# Patient Record
Sex: Female | Born: 1970
Health system: Southern US, Community
[De-identification: ages and names within clinical notes are randomized; demographics above are authoritative.]

## PROBLEM LIST (undated history)

## (undated) DIAGNOSIS — R3129 Other microscopic hematuria: Secondary | ICD-10-CM

## (undated) DIAGNOSIS — R232 Flushing: Secondary | ICD-10-CM

## (undated) DIAGNOSIS — N926 Irregular menstruation, unspecified: Secondary | ICD-10-CM

## (undated) DIAGNOSIS — E079 Disorder of thyroid, unspecified: Secondary | ICD-10-CM

## (undated) HISTORY — DX: Irregular menstruation, unspecified: N92.6

---
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
## 2012-12-10 DIAGNOSIS — K9041 Non-celiac gluten sensitivity: Secondary | ICD-10-CM | POA: Insufficient documentation

## 2012-12-12 DIAGNOSIS — E038 Other specified hypothyroidism: Secondary | ICD-10-CM | POA: Insufficient documentation

## 2012-12-12 DIAGNOSIS — E039 Hypothyroidism, unspecified: Secondary | ICD-10-CM | POA: Insufficient documentation

## 2012-12-23 DIAGNOSIS — N926 Irregular menstruation, unspecified: Secondary | ICD-10-CM | POA: Insufficient documentation

## 2012-12-23 HISTORY — DX: Irregular menstruation, unspecified: N92.6

## 2014-10-20 LAB — HM PAP SMEAR

## 2015-12-20 LAB — HM MAMMOGRAPHY

## 2016-12-18 LAB — BASIC METABOLIC PANEL
Potassium: 5.1 (ref 3.4–5.3)
SODIUM: 142 (ref 137–147)

## 2016-12-18 LAB — VITAMIN D 25 HYDROXY (VIT D DEFICIENCY, FRACTURES): Vit D, 25-Hydroxy: 103

## 2016-12-18 LAB — TSH: TSH: 2.14 (ref <=5.90)

## 2016-12-18 LAB — CBC AND DIFFERENTIAL
HEMATOCRIT: 41 (ref 36–46)
Hemoglobin: 13.6 (ref 12.0–16.0)
WBC: 6.8

## 2016-12-18 LAB — VITAMIN B12: VITAMIN B 12: 1047

## 2017-02-18 LAB — TSH: TSH: 0.95 (ref 0.41–5.90)

## 2017-07-02 LAB — HM PAP SMEAR: HM Pap smear: NEGATIVE

## 2017-12-17 LAB — VITAMIN D 25 HYDROXY (VIT D DEFICIENCY, FRACTURES): VIT D 25 HYDROXY: 26

## 2017-12-17 LAB — CBC AND DIFFERENTIAL: WBC: 9

## 2017-12-18 LAB — TSH: TSH: 1.21 (ref 0.41–5.90)

## 2018-03-14 ENCOUNTER — Emergency Department (HOSPITAL_COMMUNITY): Payer: 59

## 2018-03-14 ENCOUNTER — Other Ambulatory Visit: Payer: Self-pay

## 2018-03-14 ENCOUNTER — Emergency Department (HOSPITAL_COMMUNITY)
Admission: EM | Admit: 2018-03-14 | Discharge: 2018-03-15 | Disposition: A | Discharge status: Home / Self Care | Payer: 59 | Attending: Emergency Medicine | Admitting: Emergency Medicine

## 2018-03-14 ENCOUNTER — Encounter (HOSPITAL_COMMUNITY): Payer: Self-pay

## 2018-03-14 DIAGNOSIS — S32010A Wedge compression fracture of first lumbar vertebra, initial encounter for closed fracture: Secondary | ICD-10-CM | POA: Diagnosis not present

## 2018-03-14 DIAGNOSIS — Y999 Unspecified external cause status: Secondary | ICD-10-CM | POA: Diagnosis not present

## 2018-03-14 DIAGNOSIS — M549 Dorsalgia, unspecified: Secondary | ICD-10-CM | POA: Diagnosis present

## 2018-03-14 DIAGNOSIS — Y92009 Unspecified place in unspecified non-institutional (private) residence as the place of occurrence of the external cause: Secondary | ICD-10-CM | POA: Insufficient documentation

## 2018-03-14 DIAGNOSIS — Y939 Activity, unspecified: Secondary | ICD-10-CM | POA: Diagnosis not present

## 2018-03-14 DIAGNOSIS — Z79899 Other long term (current) drug therapy: Secondary | ICD-10-CM | POA: Diagnosis not present

## 2018-03-14 DIAGNOSIS — S22080A Wedge compression fracture of T11-T12 vertebra, initial encounter for closed fracture: Secondary | ICD-10-CM

## 2018-03-14 DIAGNOSIS — R3129 Other microscopic hematuria: Secondary | ICD-10-CM | POA: Diagnosis not present

## 2018-03-14 DIAGNOSIS — E039 Hypothyroidism, unspecified: Secondary | ICD-10-CM | POA: Diagnosis not present

## 2018-03-14 DIAGNOSIS — W130XXA Fall from, out of or through balcony, initial encounter: Secondary | ICD-10-CM | POA: Diagnosis not present

## 2018-03-14 HISTORY — DX: Disorder of thyroid, unspecified: E07.9

## 2018-03-14 LAB — URINALYSIS, ROUTINE W REFLEX MICROSCOPIC
Bacteria, UA: NONE SEEN
Bilirubin Urine: NEGATIVE
GLUCOSE, UA: NEGATIVE mg/dL
Ketones, ur: 5 mg/dL — AB
LEUKOCYTES UA: NEGATIVE
NITRITE: NEGATIVE
PH: 5 (ref 5.0–8.0)
Protein, ur: NEGATIVE mg/dL
Specific Gravity, Urine: 1.005 (ref 1.005–1.030)

## 2018-03-14 LAB — CBC WITH DIFFERENTIAL/PLATELET
BASOS ABS: 0 10*3/uL (ref 0.0–0.1)
BASOS PCT: 0 %
Eosinophils Absolute: 0.1 10*3/uL (ref 0.0–0.7)
Eosinophils Relative: 1 %
HCT: 39.8 % (ref 36.0–46.0)
HEMOGLOBIN: 13 g/dL (ref 12.0–15.0)
Lymphocytes Relative: 25 %
Lymphs Abs: 2.3 10*3/uL (ref 0.7–4.0)
MCH: 29 pg (ref 26.0–34.0)
MCHC: 32.7 g/dL (ref 30.0–36.0)
MCV: 88.8 fL (ref 78.0–100.0)
Monocytes Absolute: 0.5 10*3/uL (ref 0.1–1.0)
Monocytes Relative: 5 %
NEUTROS ABS: 6.5 10*3/uL (ref 1.7–7.7)
Neutrophils Relative %: 69 %
Platelets: 262 10*3/uL (ref 150–400)
RBC: 4.48 MIL/uL (ref 3.87–5.11)
RDW: 12.8 % (ref 11.5–15.5)
WBC: 9.4 10*3/uL (ref 4.0–10.5)

## 2018-03-14 LAB — LIPASE, BLOOD: Lipase: 33 U/L (ref 11–51)

## 2018-03-14 LAB — COMPREHENSIVE METABOLIC PANEL
ALBUMIN: 4.2 g/dL (ref 3.5–5.0)
ALK PHOS: 56 U/L (ref 38–126)
ALT: 17 U/L (ref 0–44)
ANION GAP: 10 (ref 5–15)
AST: 24 U/L (ref 15–41)
BUN: 15 mg/dL (ref 6–20)
CO2: 23 mmol/L (ref 22–32)
Calcium: 9.5 mg/dL (ref 8.9–10.3)
Chloride: 110 mmol/L (ref 98–111)
Creatinine, Ser: 0.7 mg/dL (ref 0.44–1.00)
GFR calc Af Amer: >60 mL/min (ref >60)
GFR calc non Af Amer: >60 mL/min (ref >60)
GLUCOSE: 82 mg/dL (ref 70–99)
Potassium: 4 mmol/L (ref 3.5–5.1)
SODIUM: 143 mmol/L (ref 135–145)
Total Bilirubin: 0.9 mg/dL (ref 0.3–1.2)
Total Protein: 7.1 g/dL (ref 6.5–8.1)

## 2018-03-14 MED ORDER — IOPAMIDOL (ISOVUE-300) INJECTION 61%
100.0000 mL | Freq: Once | INTRAVENOUS | Status: AC | PRN
  Administered 2018-03-14: 100 mL via INTRAVENOUS

## 2018-03-14 MED ORDER — IOHEXOL 300 MG/ML  SOLN
75.0000 mL | Freq: Once | INTRAMUSCULAR | Status: AC | PRN
  Administered 2018-03-14: 50 mL via INTRAVENOUS

## 2018-03-14 MED ORDER — IOPAMIDOL (ISOVUE-300) INJECTION 61%
INTRAVENOUS | Status: AC
  Filled 2018-03-14: qty 100

## 2018-03-14 MED ORDER — IOPAMIDOL (ISOVUE-300) INJECTION 61%: 100.0000 mL | Freq: Once | INTRAVENOUS | Status: DC | PRN

## 2018-03-14 MED ORDER — IOPAMIDOL (ISOVUE-300) INJECTION 61%
INTRAVENOUS | Status: AC
  Administered 2018-03-14: 100 mL
  Filled 2018-03-14: qty 100

## 2018-03-14 NOTE — ED Provider Notes (Signed)
[facility]-EMERGENCY DEPT Provider Note   CSN: [id number] Arrival date & time: 03/14/18  1434     History   Chief Complaint Chief Complaint  Patient presents with  . Hematuria  . Fall    HPI Judy Parks is a 47 y.o. female.  HPI 47 year old female here with back pain after fall.  The patient recently moved and externally locked herself on her second story porch.  She tried to climb off and fell backwards.  She landed directly onto her back.  Denies any head injury or loss of consciousness.  She strongly denies any headache or vision change.  No focal numbness or weakness.  She endorsed knee and back pain.  She went to urgent care and had a plain film of the knee which was negative.  However, she was noted to have hematuria and was subsequently sent for further evaluation.  She denies any gross blood in her urine.  No flank pain.  She notes that she has had history of kidney stones and has had darkened urine over the last several weeks, not just after the fall.  Denies any lower extremity numbness or weakness.  No loss of bowel or bladder function.  She is not on blood thinners.  Past Medical History:  Diagnosis Date  . Thyroid disease     There are no active problems to display for this patient.   History reviewed. No pertinent surgical history.   OB History   None      Home Medications    Prior to Admission medications   Medication Sig Start Date End Date Taking? Authorizing Provider  levothyroxine (SYNTHROID, LEVOTHROID) 25 MCG tablet Take 25 mcg by mouth daily.    Yes [provider]  liothyronine (CYTOMEL) 25 MCG tablet Take 75 mcg by mouth daily.   Yes [provider]  diclofenac sodium (VOLTAREN) 1 % GEL Apply 4 g topically 4 (four) times daily for 7 days. 03/15/18 03/22/18  Shaune Pollack, MD  HYDROcodone-acetaminophen (NORCO/VICODIN) 5-325 MG tablet Take 1-2 tablets by mouth every 4 (four) hours as needed for moderate pain or  severe pain. 03/15/18   Shaune Pollack, MD  ondansetron (ZOFRAN ODT) 4 MG disintegrating tablet Take 1 tablet (4 mg total) by mouth every 8 (eight) hours as needed for nausea or vomiting. 03/15/18   Shaune Pollack, MD    Family History History reviewed. No pertinent family history.  Social History Social History   Tobacco Use  . Smoking status: Never Smoker  . Smokeless tobacco: Never Used  Substance Use Topics  . Alcohol use: Yes    Comment: daily wine  . Drug use: Never     Allergies   Gluten meal and Penicillins   Review of Systems Review of Systems  Constitutional: Negative for chills, fatigue and fever.  HENT: Negative for congestion and rhinorrhea.   Eyes: Negative for visual disturbance.  Respiratory: Negative for cough, shortness of breath and wheezing.   Cardiovascular: Negative for chest pain and leg swelling.  Gastrointestinal: Negative for abdominal pain, diarrhea, nausea and vomiting.  Genitourinary: Negative for dysuria and flank pain.  Musculoskeletal: Positive for back pain. Negative for neck pain and neck stiffness.  Skin: Negative for rash and wound.  Allergic/Immunologic: Negative for immunocompromised state.  Neurological: Negative for syncope, weakness and headaches.  All other systems reviewed and are negative.    Physical Exam Updated Vital Signs BP 106/67   Pulse 78   Temp 97.9 F (36.6 C) (Oral)  Resp 17   Ht 5' (1.524 m)   Wt 69.4 kg   SpO2 100%   BMI 29.88 kg/m   Physical Exam  Constitutional: She is oriented to person, place, and time. She appears well-developed and well-nourished. No distress.  HENT:  Head: Normocephalic and atraumatic.  Eyes: Conjunctivae are normal.  Neck: Neck supple.  Cardiovascular: Normal rate, regular rhythm and normal heart sounds. Exam reveals no friction rub.  No murmur heard. Pulmonary/Chest: Effort normal and breath sounds normal. No respiratory distress. She has no wheezes. She has no rales.    Abdominal: She exhibits no distension.  Musculoskeletal: She exhibits no edema.  Neurological: She is alert and oriented to person, place, and time. She exhibits normal muscle tone.  Skin: Skin is warm. Capillary refill takes less than 2 seconds.  Psychiatric: She has a normal mood and affect.  Nursing note and vitals reviewed.   Spine Exam: Inspection/Palpation: Moderate TTP over lower thoracic/upper lumbar spine. No deformity. Strength: 5/5 throughout LE bilaterally (hip flexion/extension, adduction/abduction; knee flexion/extension; foot dorsiflexion/plantarflexion, inversion/eversion; great toe inversion) Sensation: Intact to light touch in proximal and distal LE bilaterally Reflexes: 2+ quadriceps and achilles reflexes  ED Treatments / Results  Labs (all labs ordered are listed, but only abnormal results are displayed) Labs Reviewed  URINALYSIS, ROUTINE W REFLEX MICROSCOPIC - Abnormal; Notable for the following components:      Result Value   APPearance HAZY (*)    Hgb urine dipstick SMALL (*)    Ketones, ur 5 (*)    All other components within normal limits  CBC WITH DIFFERENTIAL/PLATELET  COMPREHENSIVE METABOLIC PANEL  LIPASE, BLOOD    EKG None  Radiology Ct Abdomen Pelvis W Contrast  Result Date: 03/15/2018 CLINICAL DATA:  47 year old female with blunt abdominal trauma. EXAM: CT ABDOMEN AND PELVIS WITH CONTRAST TECHNIQUE: Multidetector CT imaging of the abdomen and pelvis was performed using the standard protocol following bolus administration of intravenous contrast. CONTRAST:  ISOVUE-300 IOPAMIDOL (ISOVUE-300) INJECTION 61%, 50mL OMNIPAQUE IOHEXOL 300 MG/ML SOLN COMPARISON:  CT of the abdomen pelvis dated 03/14/2018 FINDINGS: Lower chest: The visualized lung bases are clear. No intra-abdominal free air or free fluid. Hepatobiliary: There is a 1 cm cyst in the anterior liver. The liver is otherwise unremarkable. No intrahepatic biliary ductal dilatation. No  calcified gallstone or pericholecystic fluid. Pancreas: Unremarkable. No pancreatic ductal dilatation or surrounding inflammatory changes. Spleen: Normal in size without focal abnormality. Adrenals/Urinary Tract: The adrenal glands are unremarkable. Small bilateral renal cysts. There is no hydronephrosis on either side. Symmetric enhancement and excretion of contrast by both kidneys. The visualized ureters appear unremarkable. The urinary bladder is distended with contrast injected via Foley catheter. Small amount of air within the bladder likely introduced via Foley. No evidence of acute/traumatic bladder injury. Stomach/Bowel: Small scattered colonic diverticula without active inflammatory changes. There is no bowel obstruction or active inflammation. Normal appendix. Vascular/Lymphatic: The abdominal aorta and IVC are unremarkable. No portal venous gas. There is no adenopathy. Reproductive: The uterus and ovaries are grossly unremarkable. No pelvic mass. Other: None Musculoskeletal: Mild T12 and L1 superior endplate compression fractures, likely acute. No retropulsed fragment. IMPRESSION: Mild acute appearing compression fractures of the T12 and L1 superior endplate with less than 10% loss of vertebral body height. No retropulsed fragment. No other acute/traumatic intra-abdominal or pelvic pathology. No evidence of traumatic bladder injury. Electronically Signed   By: Elgie Collard M.D.   On: 03/15/2018 00:17   Ct Abdomen Pelvis W Contrast  Result Date: 03/14/2018 CLINICAL DATA:  Traumatic fall today. Blood in urine. Two months of flank pain. EXAM: CT ABDOMEN AND PELVIS WITH CONTRAST TECHNIQUE: Multidetector CT imaging of the abdomen and pelvis was performed using the standard protocol following bolus administration of intravenous contrast. CONTRAST:  ISOVUE-300 IOPAMIDOL (ISOVUE-300) INJECTION 61% COMPARISON:  None. FINDINGS: Lower chest: No acute abnormality. Hepatobiliary: Probable cyst in the  left hepatic lobe on series 2, image 17, too small to completely characterize. No suspicious liver masses are noted. The gallbladder is normal. Portal vein is patent. Pancreas: Unremarkable. No pancreatic ductal dilatation or surrounding inflammatory changes. Spleen: Normal in size without focal abnormality. Adrenals/Urinary Tract: Adrenal glands are normal. Probable small cysts in the kidneys bilaterally, too small to completely characterize. No suspicious masses are identified. No stones, hydronephrosis, or perinephric stranding identified. No renal lacerations are noted. No ureterectasis or ureteral stones noted. A focus of air anteriorly in the bladder may be from recent catheterization. The bladder is otherwise normal. Delayed images demonstrate excretion from the kidneys into the nondilated collecting systems. The upper collecting systems contain no convincing filling defects. Stomach/Bowel: Stomach and small bowel are normal. A few scattered colonic diverticuli are seen without diverticulitis. The appendix is normal. Vascular/Lymphatic: No significant vascular findings are present. No enlarged abdominal or pelvic lymph nodes. Reproductive: Uterus and bilateral adnexa are unremarkable. Other: No abdominal wall hernia or abnormality. No abdominopelvic ascites. Musculoskeletal: Mild anterior wedging of T12 and L1 with approximately 10% loss of anterior height is consistent with mild compression fractures. I am suspicious that these are acute. No other fractures. IMPRESSION: 1. Mild compression fractures of T12 and L1 are favored to be acute. 2. No cause for hematuria identified. No renal stones or suspicious masses. Probable small renal cysts. 3. A focus of air anteriorly in the bladder is probably from recent catheterization. Recommend clinical correlation. 4. No other acute abnormalities. Electronically Signed   By: Gerome Sam III M.D   On: 03/14/2018 19:51    Procedures Procedures (including critical  care time)  Medications Ordered in ED Medications  iopamidol (ISOVUE-300) 61 % injection (has no administration in time range)  iopamidol (ISOVUE-300) 61 % injection 100 mL (has no administration in time range)  iopamidol (ISOVUE-300) 61 % injection 100 mL (100 mLs Intravenous Contrast Given 03/14/18 1923)  iohexol (OMNIPAQUE) 300 MG/ML solution 75 mL (50 mLs Intravenous Contrast Given 03/14/18 2327)  iopamidol (ISOVUE-300) 61 % injection (100 mLs  Contrast Given 03/14/18 2327)  HYDROcodone-acetaminophen (NORCO/VICODIN) 5-325 MG per tablet 1 tablet (1 tablet Oral Given 03/15/18 0033)     Initial Impression / Assessment and Plan / ED Course  I have reviewed the triage vital signs and the nursing notes.  Pertinent labs & imaging results that were available during my care of the patient were reviewed by me and considered in my medical decision making (see chart for details).    47 year old female here with fall and back pain.  CT imaging shows T12 and L1 fracture.  No bony retropulsion.  Patient is fully NV intact distally with 5 out of 5 strength, normal sensation, normal reflexes.  No evidence of cauda equina.  Incidental free air noted in the bladder, so CT cystogram ordered per discussion with Dr. Alvester Morin, and fortunately shows no traumatic abnormality. Pt is otherwise awake, alert, in NAD. Not on blood thinners. Denies any head injury or LOC. Discussed with NSGY on call. Will place in TLSO, d/c with outpatient follow-up and analgesia. Will have pt  f/u with PCP re: hematuria.  Final Clinical Impressions(s) / ED Diagnoses   Final diagnoses:  Closed wedge compression fracture of twelfth thoracic vertebra, initial encounter (HCC)  Closed compression fracture of body of L1 vertebra (HCC)  Other microscopic hematuria    ED Discharge Orders         Ordered    HYDROcodone-acetaminophen (NORCO/VICODIN) 5-325 MG tablet  Every 4 hours PRN     03/15/18 0023    ondansetron (ZOFRAN ODT) 4 MG  disintegrating tablet  Every 8 hours PRN     03/15/18 0023    diclofenac sodium (VOLTAREN) 1 % GEL  4 times daily     03/15/18 0036           Shaune Pollack, MD 03/15/18 [phone]

## 2018-03-14 NOTE — ED Notes (Addendum)
Pt requested to use restroom and voided bladder, urine sample at bedside

## 2018-03-14 NOTE — ED Triage Notes (Addendum)
Pt was locked out of her house today and jumped off balcony.  Landed on her back.  When to urgent care to be evaluated.  Pt back cleared but had also c/o pain in left flank for past 2 months. Urine lab drawn and had 3+ blood in urine.  Told to seek treatment for follow up of flank pain and urine results.  Hx of kidney stones

## 2018-03-15 MED ORDER — HYDROCODONE-ACETAMINOPHEN 5-325 MG PO TABS: 1.0000 | ORAL_TABLET | ORAL | 0 refills | Status: DC | PRN

## 2018-03-15 MED ORDER — ONDANSETRON 4 MG PO TBDP: 4.0000 mg | ORAL_TABLET | Freq: Three times a day (TID) | ORAL | 0 refills | Status: DC | PRN

## 2018-03-15 MED ORDER — DICLOFENAC SODIUM 1 % TD GEL: 4.0000 g | Freq: Four times a day (QID) | TRANSDERMAL | 0 refills | Status: AC

## 2018-03-15 MED ORDER — HYDROCODONE-ACETAMINOPHEN 5-325 MG PO TABS
1.0000 | ORAL_TABLET | Freq: Once | ORAL | Status: AC
  Administered 2018-03-15: 1 via ORAL
  Filled 2018-03-15: qty 1

## 2018-03-15 NOTE — Consult Note (Signed)
H&P Physician requesting consult: Shaune Pollack, MD  Chief Complaint: micro hematuria after fall, air in bladder on CT  History of Present Illness: 47 year old female presented to an urgent care after experiencing a hard fall.  She did not notice gross hematuria but the urgent care noticed 3+ blood and therefore sent her to the emergency department.  She had a CT scan with contrast without delayed imaging.  This did not reveal any upper tract abnormalities.  However, she was noticed to have a small focus of air within the bladder in the absence of any catheterization.  Therefore, I was consulted.  To rule out any bladder injury, a cystogram was performed which revealed no evidence of any bladder injury.  Patient herself had no complaints at the time of my assessment.  She only had microscopic hematuria and denied gross hematuria.  Past Medical History:  Diagnosis Date  . Thyroid disease    History reviewed. No pertinent surgical history.  Home Medications:   (Not in a hospital admission) Allergies:  Allergies  Allergen Reactions  . Gluten Meal   . Penicillins     Has patient had a PCN reaction causing immediate rash, facial/tongue/throat swelling, SOB or lightheadedness with hypotension: No Has patient had a PCN reaction causing severe rash involving mucus membranes or skin necrosis: No Has patient had a PCN reaction that required hospitalization: Yes Has patient had a PCN reaction occurring within the last 10 years: No If all of the above answers are "NO", then may proceed with Cephalosporin use.     History reviewed. No pertinent family history. Social History:  reports that she has never smoked. She has never used smokeless tobacco. She reports that she drinks alcohol. She reports that she does not use drugs.  ROS: A complete review of systems was performed.  All systems are negative except for pertinent findings as noted. ROS   Physical Exam:  Vital signs in last 24  hours: Temp:  [97.9 F (36.6 C)] 97.9 F (36.6 C) (09/28 1450) Pulse Rate:  [73-89] 78 (09/29 0041) Resp:  [16-17] 17 (09/28 2231) BP: (106-123)/(57-83) 106/67 (09/29 0041) SpO2:  [100 %] 100 % (09/29 0041) Weight:  [69.4 kg] 69.4 kg (09/28 1452) General:  Alert and oriented, No acute distress HEENT: Normocephalic, atraumatic Neck: No JVD or lymphadenopathy Cardiovascular: Regular rate and rhythm Lungs: Regular rate and effort Abdomen: Soft, nontender, nondistended, no abdominal masses Back: No CVA tenderness Extremities: No edema Neurologic: Grossly intact  Laboratory Data:  Results for orders placed or performed during the hospital encounter of 03/14/18 (from the past 24 hour(s))  Urinalysis, Routine w reflex microscopic     Status: Abnormal   Collection Time: 03/14/18  5:39 PM  Result Value Ref Range   Color, Urine YELLOW YELLOW   APPearance HAZY (A) CLEAR   Specific Gravity, Urine 1.005 1.005 - 1.030   pH 5.0 5.0 - 8.0   Glucose, UA NEGATIVE NEGATIVE mg/dL   Hgb urine dipstick SMALL (A) NEGATIVE   Bilirubin Urine NEGATIVE NEGATIVE   Ketones, ur 5 (A) NEGATIVE mg/dL   Protein, ur NEGATIVE NEGATIVE mg/dL   Nitrite NEGATIVE NEGATIVE   Leukocytes, UA NEGATIVE NEGATIVE   RBC / HPF 6-10 0 - 5 RBC/hpf   WBC, UA 0-5 0 - 5 WBC/hpf   Bacteria, UA NONE SEEN NONE SEEN   Squamous Epithelial / LPF 0-5 0 - 5   Mucus PRESENT   CBC with Differential     Status: None   Collection  Time: 03/14/18  6:43 PM  Result Value Ref Range   WBC 9.4 4.0 - 10.5 K/uL   RBC 4.48 3.87 - 5.11 MIL/uL   Hemoglobin 13.0 12.0 - 15.0 g/dL   HCT 16.1 09.6 - 04.5 %   MCV 88.8 78.0 - 100.0 fL   MCH 29.0 26.0 - 34.0 pg   MCHC 32.7 30.0 - 36.0 g/dL   RDW 40.9 81.1 - 91.4 %   Platelets 262 150 - 400 K/uL   Neutrophils Relative % 69 %   Neutro Abs 6.5 1.7 - 7.7 K/uL   Lymphocytes Relative 25 %   Lymphs Abs 2.3 0.7 - 4.0 K/uL   Monocytes Relative 5 %   Monocytes Absolute 0.5 0.1 - 1.0 K/uL   Eosinophils  Relative 1 %   Eosinophils Absolute 0.1 0.0 - 0.7 K/uL   Basophils Relative 0 %   Basophils Absolute 0.0 0.0 - 0.1 K/uL  Comprehensive metabolic panel     Status: None   Collection Time: 03/14/18  6:43 PM  Result Value Ref Range   Sodium 143 135 - 145 mmol/L   Potassium 4.0 3.5 - 5.1 mmol/L   Chloride 110 98 - 111 mmol/L   CO2 23 22 - 32 mmol/L   Glucose, Bld 82 70 - 99 mg/dL   BUN 15 6 - 20 mg/dL   Creatinine, Ser 7.82 0.44 - 1.00 mg/dL   Calcium 9.5 8.9 - 95.6 mg/dL   Total Protein 7.1 6.5 - 8.1 g/dL   Albumin 4.2 3.5 - 5.0 g/dL   AST 24 15 - 41 U/L   ALT 17 0 - 44 U/L   Alkaline Phosphatase 56 38 - 126 U/L   Total Bilirubin 0.9 0.3 - 1.2 mg/dL   GFR calc non Af Amer >60 >60 mL/min   GFR calc Af Amer >60 >60 mL/min   Anion gap 10 5 - 15  Lipase, blood     Status: None   Collection Time: 03/14/18  6:43 PM  Result Value Ref Range   Lipase 33 11 - 51 U/L   No results found for this or any previous visit (from the past 240 hour(s)). Creatinine: Recent Labs    03/14/18 1843  CREATININE 0.70   CT scan of the abdomen and pelvis personally reviewed.  No evidence of any bladder injury.  No hydronephrosis.  No evidence of renal damage.  Impression/Assessment:  Microscopic hematuria status post trauma  Plan:  Likely just had a contusion.  Should follow-up in our office in 6 weeks for repeat urinalysis to rule out persistent microscopic hematuria that may need to be worked up.  Do not feel that delayed imaging is necessary given the mechanism of injury and falling on her back.  I think it is unlikely that she experience any significant renal or ureteral trauma.  Ray Church, III 03/15/2018, 5:36 AM

## 2018-03-15 NOTE — ED Notes (Signed)
MD to see pt once more prior to discharge. Pt with TSLO brace in place at time of discharge

## 2018-03-15 NOTE — Discharge Instructions (Signed)
For your back, wear the brace whenever upright or moving.  Do not lift anything heavier than 5 pounds until cleared by neurosurgery.  Take the medications as prescribed.  While taking these, you may want to take a stool softener to prevent constipation.  Your CT results are as follows: IMPRESSION:  Mild acute appearing compression fractures of the T12 and L1  superior endplate with less than 10% loss of vertebral body height.  No retropulsed fragment. No other acute/traumatic intra-abdominal or  pelvic pathology. No evidence of traumatic bladder injury.   For your hematuria or blood in the urine, it is very important to follow-up with your doctor as you may ultimately need a urology follow-up.  The work-up today showed no significant abnormalities or trauma, and this could be related to dehydration. Drink at least 6-8 glasses of water daily, and follow-up as above.

## 2018-03-25 ENCOUNTER — Other Ambulatory Visit: Payer: Self-pay

## 2018-03-25 ENCOUNTER — Encounter: Payer: Self-pay | Admitting: Family Medicine

## 2018-03-25 ENCOUNTER — Ambulatory Visit (INDEPENDENT_AMBULATORY_CARE_PROVIDER_SITE_OTHER): Payer: BLUE CROSS/BLUE SHIELD | Admitting: Family Medicine

## 2018-03-25 VITALS — BP 122/80 | HR 78 | Temp 98.0°F | Ht 60.0 in | Wt 150.8 lb

## 2018-03-25 DIAGNOSIS — S32010D Wedge compression fracture of first lumbar vertebra, subsequent encounter for fracture with routine healing: Secondary | ICD-10-CM | POA: Diagnosis not present

## 2018-03-25 DIAGNOSIS — S22080D Wedge compression fracture of T11-T12 vertebra, subsequent encounter for fracture with routine healing: Secondary | ICD-10-CM

## 2018-03-25 DIAGNOSIS — R3129 Other microscopic hematuria: Secondary | ICD-10-CM

## 2018-03-25 LAB — URINALYSIS, MICROSCOPIC ONLY: WBC, UA: NONE SEEN (ref 0–?)

## 2018-03-25 LAB — POCT URINALYSIS DIPSTICK
Bilirubin, UA: NEGATIVE
Glucose, UA: NEGATIVE
Ketones, UA: NEGATIVE
LEUKOCYTES UA: NEGATIVE
NITRITE UA: NEGATIVE
PROTEIN UA: NEGATIVE
Spec Grav, UA: <=1.005 — AB (ref 1.010–1.025)
UROBILINOGEN UA: 0.2 U/dL
pH, UA: 7.5 (ref 5.0–8.0)

## 2018-03-25 MED ORDER — DICLOFENAC SODIUM 1 % TD GEL: 2.0000 g | Freq: Four times a day (QID) | TRANSDERMAL | 2 refills | Status: DC

## 2018-03-25 NOTE — Progress Notes (Signed)
Subjective  CC:  Chief Complaint  Patient presents with  . Establish Care    Trasfer from [place], last physical in Spring of 2018,  Dr. Jen Mow- [place], [place]  . Fall    f/u from Fall on 9/28, Two compression fractues  . Hematuria    3+ Blood in Urine on 03/14/2018    HPI: Judy Parks is a 47 y.o. female who presents to [facility] today to establish care with me as a new patient.   She has the following concerns or needs:  Very pleasant 47 year old female here with her husband of 7 years.  Recently moved from [place] in the last month.  Unfortunately, fell from her two-story balcony and sustained a T12 and L1 compression fracture.  I reviewed her ER records.  During that evaluation she did have a CT scan of her back, abdominal pelvic CT and urogram.  She had microscopic hematuria with 6-10 red blood cells in her urinalysis.  Her scans were negative for renal or bladder trauma.  Since, she is done very well.  No longer requiring narcotics.  Using NSAIDs for pain.  She does need to start back to work and will be active.  She has follow-up with the neurosurgeon in 2 weeks.  No radicular symptoms.  No bowel or bladder problems.  No weakness in legs.  Sleeping well.  Reviewed her past medical history: Mostly well.  Does take supplements for subclinical hypothyroidism.  She is now postmenopausal.  She does not have biologic children, has 1 step son who is 28.  She is very happily remarried.  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  She is adjusting to her new surroundings.  Formerly lived in [place] and [place].  Assessment  1. Microscopic hematuria   2. Compression fracture of L1 vertebra with routine healing, subsequent encounter   3. Compression fracture of T12 vertebra with routine healing, subsequent encounter      Plan   Recent microscopic hematuria, most likely traumatic, mild.  Recheck  today.  Further follow-up if needed.  Compression fractures: Tolerating well.  Mobile.  May return to work in brace.  Tramadol if needed will avoid if possible.  No more opioids.  NSAIDs.  Has follow-up with neurosurgeon next week.  Will return for complete physical  Follow up:  Return in about 3 months (around 06/25/2018) for complete physical. Orders Placed This Encounter  Procedures  . HM MAMMOGRAPHY  . Urine Microscopic Only  . HM PAP SMEAR  . POCT urinalysis dipstick   Meds ordered this encounter  Medications  . diclofenac sodium (VOLTAREN) 1 % GEL    Sig: Apply 2 g topically 4 (four) times daily.    Dispense:  400 g    Refill:  2     Depression screen PHQ 2/9 03/25/2018  Decreased Interest 0  Down, Depressed, Hopeless 0  PHQ - 2 Score 0    We updated and reviewed the patient's past history in detail and it is documented below.  Patient Active Problem List   Diagnosis Date Noted  . Irregular menses 12/23/2012    ? History of PCOS ? History of PCOS   . Subclinical hypothyroidism 12/12/2012    Last checked 6/14 Last checked 6/14   . Non-celiac gluten sensitivity 12/10/2012   Health Maintenance  Topic Date Due  . HIV Screening  02/15/1984  . TETANUS/TDAP  02/15/1988  .  MAMMOGRAM  12/19/2016  . INFLUENZA VACCINE  03/25/2019 (Originally 01/15/2018)  . PAP SMEAR  10/20/2019    There is no immunization history on file for this patient. Current Meds  Medication Sig  . diclofenac sodium (VOLTAREN) 1 % GEL Apply 2 g topically 4 (four) times daily.  [name] levothyroxine (SYNTHROID, LEVOTHROID) 25 MCG tablet Take 25 mcg by mouth daily.   [name] liothyronine (CYTOMEL) 25 MCG tablet Take 75 mcg by mouth daily.  . [DISCONTINUED] diclofenac sodium (VOLTAREN) 1 % GEL Apply topically 4 (four) times daily.    Allergies: Patient is allergic to gluten meal and penicillins. Past Medical History Patient  has a past medical history of Thyroid disease. Past Surgical History Patient  has  no past surgical history on file. Family History: Patient family history includes Brain cancer in her maternal grandmother; Heart disease in her maternal grandmother; Stroke in her maternal grandmother. Social History:  Patient  reports that she has never smoked. She has never used smokeless tobacco. She reports that she drinks alcohol. She reports that she does not use drugs.  Review of Systems: Constitutional: negative for fever or malaise Ophthalmic: negative for photophobia, double vision or loss of vision Cardiovascular: negative for chest pain, dyspnea on exertion, or new LE swelling Respiratory: negative for SOB or persistent cough Gastrointestinal: negative for abdominal pain, change in bowel habits or melena Genitourinary: negative for dysuria or gross hematuria Musculoskeletal: negative for new gait disturbance or muscular weakness Integumentary: negative for new or persistent rashes Neurological: negative for TIA or stroke symptoms Psychiatric: negative for SI or delusions Allergic/Immunologic: negative for hives  Patient Care Team    Relationship Specialty Notifications Start End  Willow Ora, MD PCP - General Family Medicine  03/25/18     Objective  Vitals: BP 122/80   Pulse 78   Temp 98 F (36.7 C)   Ht 5' (1.524 m)   Wt 150 lb 12.8 oz (68.4 kg)   BMI 29.45 kg/m  General:  Well developed, well nourished, no acute distress  Psych:  Alert and oriented,normal mood and affect, very happy HEENT:  Normocephalic, atraumatic, non-icteric sclera, PERRL, oropharynx is without mass or exudate, supple neck without adenopathy, mass or thyromegaly Cardiovascular:  RRR without gallop, rub or murmur, nondisplaced PMI Respiratory:  Good breath sounds bilaterally, CTAB with normal respiratory effort Gastrointestinal: normal bowel sounds, soft, non-tender, no noted masses. No HSM MSK: Tenderness in mid to low back, normal gait  skin:  Warm, no rashes or suspicious lesions  noted Neurologic:    Mental status is normal. Gross motor and sensory exams are normal. Normal gait  Office Visit on 03/25/2018  Component Date Value Ref Range Status  . Color, UA 03/25/2018 Yellow   Final  . Clarity, UA 03/25/2018 Clear   Final  . Glucose, UA 03/25/2018 Negative  Negative Final  . Bilirubin, UA 03/25/2018 Negative   Final  . Ketones, UA 03/25/2018 Negative   Final  . Spec Grav, UA 03/25/2018 <=1.005* 1.010 - 1.025 Final  . Blood, UA 03/25/2018 2+   Final  . pH, UA 03/25/2018 7.5  5.0 - 8.0 Final  . Protein, UA 03/25/2018 Negative  Negative Final  . Urobilinogen, UA 03/25/2018 0.2  0.2 or 1.0 E.U./dL Final  . Nitrite, UA 16/03/9603 Negative   Final  . Leukocytes, UA 03/25/2018 Negative  Negative Final  . HM Mammogram 12/20/2015 0-4 Bi-Rad  0-4 Bi-Rad, Self Reported Normal Final  . HM Pap smear 10/20/2014 IN Patient Chart  Final     Commons side effects, risks, benefits, and alternatives for medications and treatment plan prescribed today were discussed, and the patient expressed understanding of the given instructions. Patient is instructed to call or message via MyChart if he/she has any questions or concerns regarding our treatment plan. No barriers to understanding were identified. We discussed Red Flag symptoms and signs in detail. Patient expressed understanding regarding what to do in case of urgent or emergency type symptoms.   Medication list was reconciled, printed and provided to the patient in AVS. Patient instructions and summary information was reviewed with the patient as documented in the AVS. This note was prepared with assistance of Dragon voice recognition software. Occasional wrong-word or sound-a-like substitutions may have occurred due to the inherent limitations of voice recognition software

## 2018-03-25 NOTE — Patient Instructions (Signed)
Please return in 1-3 months for your annual complete physical; please come fasting.   It was a pleasure meeting you today! Thank you for choosing [facility] to meet your healthcare needs! I truly look forward to working with you. If you have any questions or concerns, please send me a message via Mychart or call the office at [phone].

## 2018-03-26 ENCOUNTER — Telehealth: Payer: Self-pay | Admitting: Family Medicine

## 2018-03-26 MED ORDER — TRAMADOL HCL 50 MG PO TABS: 50.0000 mg | ORAL_TABLET | Freq: Four times a day (QID) | ORAL | 0 refills | Status: DC | PRN

## 2018-03-26 NOTE — Telephone Encounter (Signed)
Patient states that there was supposed to two more medications for pain called to the pharmacy. Please advise.   Kathi Simpers,  LPN

## 2018-03-26 NOTE — Telephone Encounter (Signed)
Voltaren gel was prescribed yesterday.  Tramadol prescribed today.  Please pick up from pharmacy.  May also use over-the-counter Aleve twice a day as needed and/or Tylenol.

## 2018-03-26 NOTE — Telephone Encounter (Signed)
Patient informed. She states that she will go pick up medications. She verbalized understanding  Kathi Simpers,  LPN

## 2018-03-26 NOTE — Progress Notes (Signed)
Please call patient: I have reviewed his/her lab results. Please let her know that the urine shows a few Red blood cells, but less than previously. I recommend returning in 4-6 weeks for recheck in the office, appt with me. Thanks.

## 2018-03-26 NOTE — Telephone Encounter (Signed)
Copied from CRM [id number]. Topic: Quick Communication - See Telephone Encounter >> Mar 26, 2018  8:51 AM Windy Kalata, NT wrote: CRM for notification. See Telephone encounter for: 03/26/18.  Patient established care with Dr. Mardelle Matte 03/25/18 and states she spoke with the nurse Amy and said that she would get with Dr. Mardelle Matte in regards to calling the patient 2 different pain medicine in. Patient states she did not receive any. Please advise.  [id number] - [place], [place] - [address] [place] [address] Phone: [phone] Fax: [phone]

## 2018-04-01 ENCOUNTER — Encounter: Payer: Self-pay | Admitting: Emergency Medicine

## 2018-04-01 LAB — T4, FREE
T3, Free: 3.5
T4,Free (Direct): 0.8

## 2018-04-01 LAB — TESTOSTERONE
Progesterone: 0.4
Testosterone: 2.7

## 2018-04-16 ENCOUNTER — Encounter: Payer: Self-pay | Admitting: Emergency Medicine

## 2018-05-08 ENCOUNTER — Ambulatory Visit: Payer: BLUE CROSS/BLUE SHIELD | Admitting: Family Medicine

## 2018-05-08 DIAGNOSIS — Z0289 Encounter for other administrative examinations: Secondary | ICD-10-CM

## 2018-05-29 ENCOUNTER — Encounter: Payer: Self-pay | Admitting: Family Medicine

## 2018-05-29 ENCOUNTER — Other Ambulatory Visit (INDEPENDENT_AMBULATORY_CARE_PROVIDER_SITE_OTHER): Payer: BLUE CROSS/BLUE SHIELD

## 2018-05-29 ENCOUNTER — Ambulatory Visit (INDEPENDENT_AMBULATORY_CARE_PROVIDER_SITE_OTHER): Payer: BLUE CROSS/BLUE SHIELD | Admitting: Family Medicine

## 2018-05-29 ENCOUNTER — Other Ambulatory Visit: Payer: Self-pay

## 2018-05-29 VITALS — BP 110/72 | HR 71 | Temp 97.9°F | Ht 60.0 in | Wt 156.4 lb

## 2018-05-29 DIAGNOSIS — R3129 Other microscopic hematuria: Secondary | ICD-10-CM | POA: Diagnosis not present

## 2018-05-29 DIAGNOSIS — E039 Hypothyroidism, unspecified: Secondary | ICD-10-CM | POA: Diagnosis not present

## 2018-05-29 DIAGNOSIS — Z0000 Encounter for general adult medical examination without abnormal findings: Secondary | ICD-10-CM | POA: Diagnosis not present

## 2018-05-29 DIAGNOSIS — Z23 Encounter for immunization: Secondary | ICD-10-CM | POA: Diagnosis not present

## 2018-05-29 DIAGNOSIS — E038 Other specified hypothyroidism: Secondary | ICD-10-CM

## 2018-05-29 DIAGNOSIS — Z1239 Encounter for other screening for malignant neoplasm of breast: Secondary | ICD-10-CM

## 2018-05-29 LAB — LIPID PANEL
CHOLESTEROL: 232 mg/dL — AB (ref 0–200)
HDL: 73.1 mg/dL (ref >39.00)
LDL CALC: 139 mg/dL — AB (ref 0–99)
NONHDL: 158.81
Total CHOL/HDL Ratio: 3
Triglycerides: 99 mg/dL (ref 0.0–149.0)
VLDL: 19.8 mg/dL (ref 0.0–40.0)

## 2018-05-29 LAB — POCT URINALYSIS DIPSTICK
BILIRUBIN UA: NEGATIVE
Blood, UA: POSITIVE
GLUCOSE UA: NEGATIVE
Ketones, UA: NEGATIVE
Leukocytes, UA: NEGATIVE
Nitrite, UA: NEGATIVE
Protein, UA: NEGATIVE
Spec Grav, UA: 1.01 (ref 1.010–1.025)
Urobilinogen, UA: 0.2 E.U./dL
pH, UA: 7 (ref 5.0–8.0)

## 2018-05-29 LAB — CBC WITH DIFFERENTIAL/PLATELET
BASOS ABS: 0 10*3/uL (ref 0.0–0.1)
Basophils Relative: 0.6 % (ref 0.0–3.0)
EOS ABS: 0.2 10*3/uL (ref 0.0–0.7)
Eosinophils Relative: 3.1 % (ref 0.0–5.0)
HCT: 38.4 % (ref 36.0–46.0)
HEMOGLOBIN: 12.8 g/dL (ref 12.0–15.0)
LYMPHS ABS: 2.6 10*3/uL (ref 0.7–4.0)
Lymphocytes Relative: 52.9 % — ABNORMAL HIGH (ref 12.0–46.0)
MCHC: 33.3 g/dL (ref 30.0–36.0)
MCV: 89.1 fl (ref 78.0–100.0)
MONO ABS: 0.3 10*3/uL (ref 0.1–1.0)
Monocytes Relative: 7.1 % (ref 3.0–12.0)
NEUTROS PCT: 36.3 % — AB (ref 43.0–77.0)
Neutro Abs: 1.8 10*3/uL (ref 1.4–7.7)
Platelets: 259 10*3/uL (ref 150.0–400.0)
RBC: 4.31 Mil/uL (ref 3.87–5.11)
RDW: 13.5 % (ref 11.5–15.5)
WBC: 4.8 10*3/uL (ref 4.0–10.5)

## 2018-05-29 LAB — URINALYSIS, ROUTINE W REFLEX MICROSCOPIC
Bilirubin Urine: NEGATIVE
Ketones, ur: NEGATIVE
Leukocytes, UA: NEGATIVE
Nitrite: NEGATIVE
Specific Gravity, Urine: <=1.005 — AB (ref 1.000–1.030)
Total Protein, Urine: NEGATIVE
Urine Glucose: NEGATIVE
Urobilinogen, UA: 0.2 (ref 0.0–1.0)
pH: 7 (ref 5.0–8.0)

## 2018-05-29 LAB — POCT GLYCOSYLATED HEMOGLOBIN (HGB A1C): HEMOGLOBIN A1C: 5 % (ref 4.0–5.6)

## 2018-05-29 LAB — COMPREHENSIVE METABOLIC PANEL
ALK PHOS: 61 U/L (ref 39–117)
ALT: 16 U/L (ref 0–35)
AST: 18 U/L (ref 0–37)
Albumin: 4.3 g/dL (ref 3.5–5.2)
BUN: 17 mg/dL (ref 6–23)
CO2: 26 mEq/L (ref 19–32)
CREATININE: 0.66 mg/dL (ref 0.40–1.20)
Calcium: 9.5 mg/dL (ref 8.4–10.5)
Chloride: 105 mEq/L (ref 96–112)
GFR: 101.05 mL/min (ref >60.00)
GLUCOSE: 93 mg/dL (ref 70–99)
POTASSIUM: 4.5 meq/L (ref 3.5–5.1)
SODIUM: 138 meq/L (ref 135–145)
Total Bilirubin: 0.7 mg/dL (ref 0.2–1.2)
Total Protein: 7.2 g/dL (ref 6.0–8.3)

## 2018-05-29 LAB — TSH: TSH: 1.13 u[IU]/mL (ref 0.35–4.50)

## 2018-05-29 LAB — MICROALBUMIN / CREATININE URINE RATIO
Creatinine,U: 15.5 mg/dL
Microalb Creat Ratio: 4.5 mg/g (ref 0.0–30.0)

## 2018-05-29 LAB — T4, FREE: Free T4: 0.52 ng/dL — ABNORMAL LOW (ref 0.60–1.60)

## 2018-05-29 NOTE — Progress Notes (Signed)
Subjective  Chief Complaint  Patient presents with  . Follow-up    doing well, needs labs drawn for work   . Annual Exam    HPI: Judy Parks is a 47 y.o. female who presents to [facility] today for a Female Wellness Visit.   Wellness Visit: annual visit with health maintenance review and exam without Pap   HM: doing well. Resolved lumbar compression fracture from fall. Does need f/u on h/o microscopic hematuria. Denies all urinary sxs.   Due for mammogram and fasting lab work.   H/o subclinical hypothyroidism on replacement; takes meds as directed. Denies h/o low or high thyroid sxs. Has gained a little weight due to change in diet: transition to busy new job playing a role.   Assessment  1. Annual physical exam   2. Subclinical hypothyroidism   3. Breast cancer screening   4. Microscopic hematuria      Plan  Female Wellness Visit:  Age appropriate Health Maintenance and Prevention measures were discussed with patient. Included topics are cancer screening recommendations, ways to keep healthy (see AVS) including dietary and exercise recommendations, regular eye and dental care, use of seat belts, and avoidance of moderate alcohol use and tobacco use.   BMI: discussed patient's BMI and encouraged positive lifestyle modifications to help get to or maintain a target BMI.  HM needs and immunizations were addressed and ordered. See below for orders. See HM and immunization section for updates.  Routine labs and screening tests ordered including cmp, cbc and lipids where appropriate.  Discussed recommendations regarding Vit D and calcium supplementation (see AVS)  Follow up: Return in about 1 year (around 05/30/2019) for complete physical.   Orders Placed This Encounter  Procedures  . Urine Culture  . MM DIGITAL SCREENING BILATERAL  . Tdap vaccine greater than or equal to 7yo IM  . CBC with Differential/Platelet  . Comprehensive metabolic  panel  . Lipid panel  . HIV Antibody (routine testing w rflx)  . TSH  . T4, free  . T3  . Urine Microalbumin w/creat. ratio  . POCT glycosylated hemoglobin (Hb A1C)  . POCT Urinalysis Dipstick   No orders of the defined types were placed in this encounter.    Lifestyle: Body mass index is 30.54 kg/m. Wt Readings from Last 3 Encounters:  05/29/18 156 lb 6.4 oz (70.9 kg)  03/25/18 150 lb 12.8 oz (68.4 kg)  03/14/18 153 lb (69.4 kg)   Diet: general Exercise: rarely,   Patient Active Problem List   Diagnosis Date Noted  . Subclinical hypothyroidism 12/12/2012    Last checked 6/14    . Non-celiac gluten sensitivity 12/10/2012   Health Maintenance  Topic Date Due  . HIV Screening  02/15/1984  . MAMMOGRAM  12/19/2016  . PAP SMEAR-Modifier  07/02/2022  . TETANUS/TDAP  05/29/2028  . INFLUENZA VACCINE  Discontinued   Immunization History  Administered Date(s) Administered  . Tdap 05/29/2018   We updated and reviewed the patient's past history in detail and it is documented below. Allergies: Patient is allergic to gluten meal and penicillins. Past Medical History Patient  has a past medical history of Irregular menses (12/23/2012) and Thyroid disease. Past Surgical History Patient  has no past surgical history on file. Family History: Patient family history includes Brain cancer in her maternal grandmother; Heart disease in her maternal grandmother; Stroke in her maternal grandmother. Social History:  Patient  reports that she has never smoked. She has never  used smokeless tobacco. She reports current alcohol use. She reports that she does not use drugs.  Review of Systems: Constitutional: negative for fever or malaise Ophthalmic: negative for photophobia, double vision or loss of vision Cardiovascular: negative for chest pain, dyspnea on exertion, or new LE swelling Respiratory: negative for SOB or persistent cough Gastrointestinal: negative for abdominal pain, change  in bowel habits or melena Genitourinary: negative for dysuria or gross hematuria, no abnormal uterine bleeding or disharge Musculoskeletal: negative for new gait disturbance or muscular weakness Integumentary: negative for new or persistent rashes, no breast lumps Neurological: negative for TIA or stroke symptoms Psychiatric: negative for SI or delusions Allergic/Immunologic: negative for hives  Patient Care Team    Relationship Specialty Notifications Start End  Willow Ora, MD PCP - General Family Medicine  03/25/18     Objective  Vitals: BP 110/72   Pulse 71   Temp 97.9 F (36.6 C)   Ht 5' (1.524 m)   Wt 156 lb 6.4 oz (70.9 kg)   SpO2 98%   BMI 30.54 kg/m  General:  Well developed, well nourished, no acute distress  Psych:  Alert and orientedx3,normal mood and affect HEENT:  Normocephalic, atraumatic, non-icteric sclera, PERRL, oropharynx is clear without mass or exudate, supple neck without adenopathy, mass or thyromegaly Cardiovascular:  Normal S1, S2, RRR without gallop, rub or murmur, nondisplaced PMI Respiratory:  Good breath sounds bilaterally, CTAB with normal respiratory effort Gastrointestinal: normal bowel sounds, soft, non-tender, no noted masses. No HSM MSK: no deformities, contusions. Joints are without erythema or swelling. Spine and CVA region are nontender Skin:  Warm, no rashes or suspicious lesions noted Neurologic:    Mental status is normal. CN 2-11 are normal. Gross motor and sensory exams are normal. Normal gait. No tremor Breast Exam: No mass, skin retraction or nipple discharge is appreciated in either breast. No axillary adenopathy. Fibrocystic changes are not noted  Commons side effects, risks, benefits, and alternatives for medications and treatment plan prescribed today were discussed, and the patient expressed understanding of the given instructions. Patient is instructed to call or message via MyChart if he/she has any questions or concerns  regarding our treatment plan. No barriers to understanding were identified. We discussed Red Flag symptoms and signs in detail. Patient expressed understanding regarding what to do in case of urgent or emergency type symptoms.   Medication list was reconciled, printed and provided to the patient in AVS. Patient instructions and summary information was reviewed with the patient as documented in the AVS. This note was prepared with assistance of Dragon voice recognition software. Occasional wrong-word or sound-a-like substitutions may have occurred due to the inherent limitations of voice recognition software

## 2018-05-29 NOTE — Patient Instructions (Signed)
Please return in 12 months for your annual complete physical; please come fasting.  I will release your lab results to you on your MyChart account with further instructions. Please reply with any questions.   We will call you with information regarding your referral appointment. [facility]. If you do not hear from us within the next 2 weeks, please let me know. It can take 1-2 weeks to get appointments set up with the specialists.    If you have any questions or concerns, please don't hesitate to send me a message via MyChart or call the office at [phone]. Thank you for visiting with us today! It's our pleasure caring for you.  Please do these things to maintain good health!   Exercise at least 30-45 minutes a day,  4-5 days a week.   Eat a low-fat diet with lots of fruits and vegetables, up to 7-9 servings per day.  Drink plenty of water daily. Try to drink 8 8oz glasses per day.  Seatbelts can save your life. Always wear your seatbelt.  Place Smoke Detectors on every level of your home and check batteries every year.  Schedule an appointment with an eye doctor for an eye exam every 1-2 years  Safe sex - use condoms to protect yourself from STDs if you could be exposed to these types of infections. Use birth control if you do not want to become pregnant and are sexually active.  Avoid heavy alcohol use. If you drink, keep it to less than 2 drinks/day and not every day.  Health Care Power of Attorney.  Choose someone you trust that could speak for you if you became unable to speak for yourself.  Depression is common in our stressful world.If you're feeling down or losing interest in things you normally enjoy, please come in for a visit.  If anyone is threatening or hurting you, please get help. Physical or Emotional Violence is never OK.

## 2018-05-30 LAB — T3: T3, Total: 228 ng/dL — ABNORMAL HIGH (ref 76–181)

## 2018-05-30 LAB — HIV ANTIBODY (ROUTINE TESTING W REFLEX): HIV: NONREACTIVE

## 2018-05-31 LAB — URINE CULTURE
MICRO NUMBER:: 91495654
SPECIMEN QUALITY:: ADEQUATE

## 2018-06-01 ENCOUNTER — Encounter: Payer: Self-pay | Admitting: Family Medicine

## 2018-06-01 MED ORDER — CEPHALEXIN 500 MG PO CAPS: 500.0000 mg | ORAL_CAPSULE | Freq: Two times a day (BID) | ORAL | 0 refills | Status: DC

## 2018-06-01 MED ORDER — FLUCONAZOLE 150 MG PO TABS: ORAL_TABLET | ORAL | 0 refills | Status: DC

## 2018-06-01 NOTE — Addendum Note (Signed)
Addended by: Asencion PartridgeANDY, Cleburn Maiolo on: 06/01/2018 12:52 PM   Modules accepted: Orders

## 2018-06-02 ENCOUNTER — Telehealth: Payer: Self-pay | Admitting: Family Medicine

## 2018-06-02 NOTE — Telephone Encounter (Signed)
Copied from CRM [id number]. Topic: Quick Communication - See Telephone Encounter >> Jun 02, 2018  3:05 PM Floria RavelingStovall, Shana A wrote: CRM for notification. See Telephone encounter for: 06/02/18.  Pharmacy called and stated that pt was concerned about taking the cephALEXin (KEFLEX) 500 MG capsule [[id number]][[id number]] because she is allergic to penicillin.  She said she was getting ready to get on a flight and did not want to risk a reaction.  Please advise

## 2018-06-02 NOTE — Telephone Encounter (Signed)
See message and advise.   Marijose Curington,  LPN   

## 2018-06-03 NOTE — Telephone Encounter (Signed)
Ok to take after her trip.  She should be fine eventhough allergic to PCN. Recommend having benadryl on hand in case of rash and would let me know if that helps. MOST people can tolerate cephalosporins even though allergy to PCN.  Postpone nurse visit for f/u UA 2 weeks after taking meds.

## 2018-06-03 NOTE — Telephone Encounter (Signed)
Patient states that her reaction to PCN is that she cannot walk when she takes that medication. Not a rash. I advised it should be okay to take but she wanted your recommendations.

## 2018-06-04 NOTE — Telephone Encounter (Signed)
Do not take the antibioitc.  Complete the yeast medication only. Recheck urine once she returns from travel.  Will send to urology if microscopic hematuria persists.  thanks

## 2018-06-04 NOTE — Telephone Encounter (Signed)
LM to  RC, CRM Created  

## 2018-06-05 NOTE — Telephone Encounter (Signed)
LM to RC  

## 2018-06-09 ENCOUNTER — Encounter: Payer: Self-pay | Admitting: *Deleted

## 2018-06-09 NOTE — Telephone Encounter (Signed)
Message sent to MyChart.

## 2018-06-24 ENCOUNTER — Telehealth: Payer: Self-pay | Admitting: Emergency Medicine

## 2018-06-24 NOTE — Telephone Encounter (Signed)
Copied from CRM #[id number]. Topic: Complaint - Billing/Coding >> Jun 24, 2018  8:49 AM Johnson, Chaz E wrote: DOS: 11.22.2019 Details of complaint: Pt was charged a No show fee. Pt stated that after her new Pt appt she was scheduled for a recheck for something with her urine, but dr. Andy advised Pt not to come in and that they would do it at a later date. Pt was then scheduled for an OV 12.13.2019 where she completed the labs for her urine. Pt states the no show appt should have been cancelled once provider advise Pt not to come in / please advise  How would the patient like to see this issue resolved? waive the no show fee    Will automatically be routed to [facility] Coding pool. 

## 2018-06-24 NOTE — Telephone Encounter (Signed)
Marchelle Folks, can you take a look at this one. I see where to Amy and she schedule a 4-6 wk appt. Pt missed it and reschedule on 12/5 for 12/13 for a reck. Not sure if this needs to be sent to CC or not.

## 2018-06-24 NOTE — Telephone Encounter (Signed)
Copied from CRM [id number]. Topic: Complaint - Billing/Coding >> Jun 24, 2018  8:49 AM Judy Parks E wrote: DOS: 11.22.2019 Details of complaint: Pt was charged a No show fee. Pt stated that after her new Pt appt she was scheduled for a recheck for something with her urine, but dr. Mardelle Matte advised Pt not to come in and that they would do it at a later date. Pt was then scheduled for an OV 12.13.2019 where she completed the labs for her urine. Pt states the no show appt should have been cancelled once provider advise Pt not to come in / please advise  How would the patient like to see this issue resolved? waive the no show fee    Will automatically be routed to [facility] Coding pool.

## 2018-07-17 NOTE — Telephone Encounter (Signed)
Pt would like a something showing that no show was taken care of and sent to her email. Please advise [email or web address]

## 2018-07-23 ENCOUNTER — Other Ambulatory Visit: Payer: Self-pay

## 2018-07-23 ENCOUNTER — Ambulatory Visit (INDEPENDENT_AMBULATORY_CARE_PROVIDER_SITE_OTHER): Payer: BLUE CROSS/BLUE SHIELD | Admitting: Family Medicine

## 2018-07-23 ENCOUNTER — Encounter: Payer: Self-pay | Admitting: Family Medicine

## 2018-07-23 VITALS — BP 112/74 | HR 74 | Temp 97.9°F | Resp 99 | Ht 60.0 in | Wt 160.8 lb

## 2018-07-23 DIAGNOSIS — R8271 Bacteriuria: Secondary | ICD-10-CM

## 2018-07-23 DIAGNOSIS — R3129 Other microscopic hematuria: Secondary | ICD-10-CM

## 2018-07-23 DIAGNOSIS — R6882 Decreased libido: Secondary | ICD-10-CM

## 2018-07-23 NOTE — Patient Instructions (Addendum)
Please follow up if symptoms do not improve or as needed.    Menopause and Hormone Replacement Therapy What is hormone replacement therapy?  Hormone replacement therapy (HRT) is the use of artificial (synthetic) hormones to replace hormones that your body stops producing during menopause. Menopause is the normal time of life when menstrual periods stop completely and the ovaries stop producing the female hormones estrogen and progesterone. This lack of hormones can affect your health and cause undesirable symptoms. HRT can relieve some of those symptoms. What are my options for HRT? HRT may consist of the synthetic hormones estrogen and progestin, or it may consist of only estrogen (estrogen-only therapy). You and your health care provider will decide which form of HRT is best for you. If you choose to be on HRT and you have a uterus, estrogen and progestin are usually prescribed. Estrogen-only therapy is used for women who do not have a uterus. Possible options for taking HRT include:  Pills.  Patches.  Gels.  Sprays.  Vaginal cream.  Vaginal rings.  Vaginal inserts. The amount of hormone(s) that you take and how long you take the hormone(s) varies depending on your individual health. It is important to:  Begin HRT with the lowest possible dosage.  Stop HRT as soon as your health care provider tells you to stop.  Work with your health care provider so that you feel informed and comfortable with your decisions. What are the benefits of HRT? HRT can reduce the frequency and severity of menopausal symptoms. Benefits of HRT vary depending on the menopausal symptoms that you have, the severity of your symptoms, and your overall health. HRT may help to improve the following menopausal symptoms:  Hot flashes and night sweats. These are sudden feelings of heat that spread over the face and body. The skin may turn red, like a blush. Night sweats are hot flashes that happen while you are  sleeping or trying to sleep.  Bone loss (osteoporosis). The body loses calcium more quickly after menopause, causing the bones to become weaker. This can increase the risk for bone breaks (fractures).  Vaginal dryness. The lining of the vagina can become thin and dry, which can cause pain during sexual intercourse or cause infection, burning, or itching.  Urinary tract infections.  Urinary incontinence. This is a decreased ability to control when you urinate.  Irritability.  Short-term memory problems. What are the risks of HRT? Risks of HRT vary depending on your individual health and medical history. Risks of HRT also depend on whether you receive both estrogen and progestin or you receive estrogen only.HRT may increase the risk of:  Spotting. This is when a small amount of bloodleaks from the vagina unexpectedly.  Endometrial cancer. This cancer is in the lining of the uterus (endometrium).  Breast cancer.  Increased density of breast tissue. This can make it harder to find breast cancer on a breast X-ray (mammogram).  Stroke.  Heart attack.  Blood clots.  Gallbladder disease. Risks of HRT can increase if you have any of the following conditions:  Endometrial cancer.  Liver disease.  Heart disease.  Breast cancer.  History of blood clots.  History of stroke. How should I care for myself while I am on HRT?  Take over-the-counter and prescription medicines only as told by your health care provider.  Get mammograms, pelvic exams, and medical checkups as often as told by your health care provider.  Have Pap tests done as often as told by your health  care provider. A Pap test is sometimes called a Pap smear. It is a screening test that is used to check for signs of cancer of the cervix and vagina. A Pap test can also identify the presence of infection or precancerous changes. Pap tests may be done: ? Every 3 years, starting at age 50. ? Every 5 years, starting  after age 50, in combination with testing for human papillomavirus (HPV). ? More often or less often depending on other medical conditions you have, your age, and other risk factors.  It is your responsibility to get your Pap test results. Ask your health care provider or the department performing the test when your results will be ready.  Keep all follow-up visits as told by your health care provider. This is important. When should I seek medical care? Talk with your health care provider if:  You have any of these: ? Pain or swelling in your legs. ? Shortness of breath. ? Chest pain. ? Lumps or changes in your breasts or armpits. ? Slurred speech. ? Pain, burning, or bleeding when you urine.  You develop any of these: ? Unusual vaginal bleeding. ? Dizziness or headaches. ? Weakness or numbness in any part of your arms or legs. ? Pain in your abdomen. This information is not intended to replace advice given to you by your health care provider. Make sure you discuss any questions you have with your health care provider. Document Released: 03/02/2003 Document Revised: 01/16/2017 Document Reviewed: 12/05/2014 Elsevier Interactive Patient Education  2019 [employer].

## 2018-07-23 NOTE — Progress Notes (Signed)
   Subjective  CC:  Chief Complaint  Patient presents with  . Follow-up    UTIs, she reports that she has an occassional pain/discomfort and vaginal dryness    HPI: Judy Parks is a 48 y.o. female who presents to the office today to address the problems listed above in the chief complaint.  Here for f/u of persistnet microscopic hematuria: first started after a fall from second story balcony; last checked 2-3 weeks ago with 3-6 RBC and GBS bacteruria with yeast. Treated for both and now to be rechecked. No urinary sxs nor gross hematuria.   C/o decreased libido and intermittent vaginal dryness w/o other perimenopausal sxs.   Assessment  1. Microscopic hematuria   2. Group B streptococcal bacteriuria   3. Decreased libido      Plan   Recheck urine:  To urology if persists.   Discussed possible HRT or behavioral management of decreased libido. Pt to research it and return. She is a good candidate.   Follow up: prn  Visit date not found  Orders Placed This Encounter  Procedures  . Urine Culture  . Urinalysis, Routine w reflex microscopic   No orders of the defined types were placed in this encounter.     I reviewed the patients updated PMH, FH, and SocHx.    Patient Active Problem List   Diagnosis Date Noted  . Decreased libido 07/23/2018  . Subclinical hypothyroidism 12/12/2012  . Non-celiac gluten sensitivity 12/10/2012   Current Meds  Medication Sig  . diclofenac sodium (VOLTAREN) 1 % GEL Apply 2 g topically 4 (four) times daily.  [name] levothyroxine (SYNTHROID, LEVOTHROID) 25 MCG tablet Take 25 mcg by mouth daily.     Allergies: Patient is allergic to gluten meal and penicillins. Family History: Patient family history includes Brain cancer in her maternal grandmother; Heart disease in her maternal grandmother; Stroke in her maternal grandmother. Social History:  Patient  reports that she has never smoked. She has never used smokeless tobacco. She reports current  alcohol use. She reports that she does not use drugs.  Review of Systems: Constitutional: Negative for fever malaise or anorexia Cardiovascular: negative for chest pain Respiratory: negative for SOB or persistent cough Gastrointestinal: negative for abdominal pain  Objective  Vitals: BP 112/74   Pulse 74   Temp 97.9 F (36.6 C) (Oral)   Resp (!) 99   Ht 5' (1.524 m)   Wt 160 lb 12.8 oz (72.9 kg)   BMI 31.40 kg/m  General: no acute distress , A&Ox3 Skin:  Warm, no rashes     Commons side effects, risks, benefits, and alternatives for medications and treatment plan prescribed today were discussed, and the patient expressed understanding of the given instructions. Patient is instructed to call or message via MyChart if he/she has any questions or concerns regarding our treatment plan. No barriers to understanding were identified. We discussed Red Flag symptoms and signs in detail. Patient expressed understanding regarding what to do in case of urgent or emergency type symptoms.   Medication list was reconciled, printed and provided to the patient in AVS. Patient instructions and summary information was reviewed with the patient as documented in the AVS. This note was prepared with assistance of Dragon voice recognition software. Occasional wrong-word or sound-a-like substitutions may have occurred due to the inherent limitations of voice recognition software

## 2018-07-24 LAB — URINALYSIS, ROUTINE W REFLEX MICROSCOPIC
Bilirubin Urine: NEGATIVE
Ketones, ur: NEGATIVE
Leukocytes, UA: NEGATIVE
Nitrite: NEGATIVE
Specific Gravity, Urine: 1.02 (ref 1.000–1.030)
Total Protein, Urine: NEGATIVE
Urine Glucose: NEGATIVE
Urobilinogen, UA: 0.2 (ref 0.0–1.0)
WBC, UA: NONE SEEN (ref 0–?)
pH: 6 (ref 5.0–8.0)

## 2018-07-25 LAB — URINE CULTURE
MICRO NUMBER: 162496
SPECIMEN QUALITY:: ADEQUATE

## 2018-07-27 ENCOUNTER — Encounter: Payer: Self-pay | Admitting: *Deleted

## 2018-07-27 ENCOUNTER — Other Ambulatory Visit: Payer: Self-pay | Admitting: *Deleted

## 2018-07-27 DIAGNOSIS — R3129 Other microscopic hematuria: Secondary | ICD-10-CM

## 2018-08-03 ENCOUNTER — Encounter: Payer: Self-pay | Admitting: Family Medicine

## 2018-08-11 DIAGNOSIS — R3121 Asymptomatic microscopic hematuria: Secondary | ICD-10-CM | POA: Diagnosis not present

## 2019-01-08 ENCOUNTER — Telehealth: Payer: Self-pay | Admitting: Family Medicine

## 2019-01-08 NOTE — Telephone Encounter (Signed)
Pt has been called and scheduled.

## 2019-01-08 NOTE — Telephone Encounter (Signed)
Patient wants an appointment sooner than the next available which according to Dr. Tamela Oddi preferences is later in August. Please advise.    Copied from [place]. Topic: General - Inquiry >> Jan 08, 2019 12:46 PM Richardo Priest, NT wrote: Reason for CRM: Patient called in checking to see if she can get her thyroid checked to see if she should restart medication. Patient would also like to have her annual blood work. Call back is [phone].

## 2019-01-14 ENCOUNTER — Ambulatory Visit (INDEPENDENT_AMBULATORY_CARE_PROVIDER_SITE_OTHER): Payer: 59 | Admitting: Family Medicine

## 2019-01-14 ENCOUNTER — Other Ambulatory Visit: Payer: Self-pay

## 2019-01-14 ENCOUNTER — Encounter: Payer: Self-pay | Admitting: Family Medicine

## 2019-01-14 VITALS — BP 102/70 | HR 84 | Temp 98.3°F | Resp 16 | Ht 60.0 in | Wt 165.4 lb

## 2019-01-14 DIAGNOSIS — E039 Hypothyroidism, unspecified: Secondary | ICD-10-CM

## 2019-01-14 DIAGNOSIS — J301 Allergic rhinitis due to pollen: Secondary | ICD-10-CM

## 2019-01-14 DIAGNOSIS — R300 Dysuria: Secondary | ICD-10-CM

## 2019-01-14 DIAGNOSIS — E038 Other specified hypothyroidism: Secondary | ICD-10-CM

## 2019-01-14 DIAGNOSIS — K219 Gastro-esophageal reflux disease without esophagitis: Secondary | ICD-10-CM

## 2019-01-14 DIAGNOSIS — M549 Dorsalgia, unspecified: Secondary | ICD-10-CM

## 2019-01-14 DIAGNOSIS — F430 Acute stress reaction: Secondary | ICD-10-CM

## 2019-01-14 DIAGNOSIS — K9041 Non-celiac gluten sensitivity: Secondary | ICD-10-CM

## 2019-01-14 LAB — POCT URINALYSIS DIPSTICK
Bilirubin, UA: NEGATIVE
Blood, UA: POSITIVE
Glucose, UA: NEGATIVE
Ketones, UA: NEGATIVE
Leukocytes, UA: NEGATIVE
Nitrite, UA: NEGATIVE
Protein, UA: NEGATIVE
Spec Grav, UA: <=1.005 — AB (ref 1.010–1.025)
Urobilinogen, UA: 0.2 E.U./dL
pH, UA: 6 (ref 5.0–8.0)

## 2019-01-14 MED ORDER — FLUTICASONE PROPIONATE 50 MCG/ACT NA SUSP: 1.0000 | Freq: Every day | NASAL | 6 refills | Status: AC

## 2019-01-14 MED ORDER — OMEPRAZOLE 20 MG PO CPDR: 20.0000 mg | DELAYED_RELEASE_CAPSULE | Freq: Every day | ORAL | 3 refills | Status: AC

## 2019-01-14 NOTE — Patient Instructions (Signed)
Please return in 2 weeks for recheck.   Start medication for allergies and GERD.  Start meditation and deep breathing exercises   If you have any questions or concerns, please don't hesitate to send me a message via MyChart or call the office at [phone]. Thank you for visiting with [place] today! It's our pleasure caring for you.

## 2019-01-14 NOTE — Progress Notes (Signed)
Subjective  CC:  Chief Complaint  Patient presents with  . Hypothyroidism    Has not had 25 mcg in over 1 month  . Changes in bowels    Started about 2 months ago, reports pressure and pain in the left side of her flank  . Buring with urination    States that it comes and goes, does not remember if she did go to urologist  . Allergies    Takes Zyrtec but causes indigestion and heartburn    HPI: Judy Parks is a 48 y.o. female who presents to the office today to address the problems listed above in the chief complaint.  Patient comes in with multiple complaints.  She is concerned that something is wrong.  She has been out of her thyroid supplement for over a month.  Complains of feeling pressure in the left side of her body for several weeks.  Has intermittent urinary symptoms.  Complains of poor digestion and some upper abdominal symptoms.  Harder to go to the bathroom but describes normally formed stool.  No blood in the stool.  No hematemesis nausea or vomiting.  No gross hematuria.  She does have persistent microscopic hematuria but never got follow-up for urology referral although it was placed in February.  Also has active allergies that responds to Zyrtec but feels that this causes indigestion.  She appears and admits to being very stressed.  Her uncle recently passed away from COVID-19 and this was very difficult for her.  He lives in [place].  She lost her job back in May.  She has been home bound pretty much completely since March only traveling up to the grocery store 3-4 times in that time.[name].  Her husband is still working fortunately but she has guilt about that as well.  Sleep is fair.  No history of depression.  She has had an occasional panic attack.  She denies chest pain or shortness of breath.  No fevers or chills. Assessment  1. Subclinical hypothyroidism   2. Burning with urination   3. Non-celiac gluten sensitivity   4. Seasonal allergic rhinitis due to pollen   5. Stress  reaction   6. Gastroesophageal reflux disease, esophagitis presence not specified   7. Left-sided back pain, unspecified back location, unspecified chronicity      Plan   Stress reaction: Extensive counseling done.  Stress likely causing several of her above-noted symptoms.  Recommend relaxation techniques, meditation, deep breathing exercises.  Patient defers medications at this time.  Close follow-up recommended.  Allergies: Add Flonase to Zyrtec  GERD/gastritis: Start daily PPI  Microscopic hematuria with urinary symptoms: Refer to urology.  Recheck urine and urine culture.  History of gluten sensitivity: Aggravated by stress.  Possible IBS as well.  Counseling done.  Follow up: Return in about 2 weeks (around 01/28/2019) for result review, recheck.  Visit date not found  Orders Placed This Encounter  Procedures  . Urine Culture  . Comprehensive metabolic panel  . T4, free  . T3  . TSH  . CBC with Differential/Platelet  . POCT urinalysis dipstick   Meds ordered this encounter  Medications  . omeprazole (PRILOSEC) 20 MG capsule    Sig: Take 1 capsule (20 mg total) by mouth daily.    Dispense:  30 capsule    Refill:  3  . fluticasone (FLONASE) 50 MCG/ACT nasal spray    Sig: Place 1 spray into both nostrils daily.    Dispense:  16 g  Refill:  6      I reviewed the patients updated PMH, FH, and SocHx.    Patient Active Problem List   Diagnosis Date Noted  . Decreased libido 07/23/2018  . Subclinical hypothyroidism 12/12/2012  . Non-celiac gluten sensitivity 12/10/2012   No outpatient medications have been marked as taking for the 01/14/19 encounter (Office Visit) with Leamon Arnt, MD.    Allergies: Patient is allergic to gluten meal and penicillins. Family History: Patient family history includes Brain cancer in her maternal grandmother; Heart disease in her maternal grandmother; Stroke in her maternal grandmother. Social History:  Patient  reports that  she has never smoked. She has never used smokeless tobacco. She reports current alcohol use. She reports that she does not use drugs.  Review of Systems: Constitutional: Negative for fever malaise or anorexia Cardiovascular: negative for chest pain Respiratory: negative for SOB or persistent cough Gastrointestinal: negative for abdominal pain  Objective  Vitals: BP 102/70   Pulse 84   Temp 98.3 F (36.8 C) (Oral)   Resp 16   Ht 5' (1.524 m)   Wt 165 lb 6.4 oz (75 kg)   SpO2 96%   BMI 32.30 kg/m  General: no acute distress , A&Ox3, appears anxious HEENT: PEERL, conjunctiva normal, Oropharynx moist,neck is supple Cardiovascular:  RRR without murmur or gallop.  Respiratory:  Good breath sounds bilaterally, CTAB with normal respiratory effort, no CVA tenderness Gastrointestinal: soft, flat abdomen, normal active bowel sounds, no palpable masses, no hepatosplenomegaly, no appreciated hernias Skin:  Warm, no rashes     Commons side effects, risks, benefits, and alternatives for medications and treatment plan prescribed today were discussed, and the patient expressed understanding of the given instructions. Patient is instructed to call or message via MyChart if he/she has any questions or concerns regarding our treatment plan. No barriers to understanding were identified. We discussed Red Flag symptoms and signs in detail. Patient expressed understanding regarding what to do in case of urgent or emergency type symptoms.   Medication list was reconciled, printed and provided to the patient in AVS. Patient instructions and summary information was reviewed with the patient as documented in the AVS. This note was prepared with assistance of Dragon voice recognition software. Occasional wrong-word or sound-a-like substitutions may have occurred due to the inherent limitations of voice recognition software

## 2019-01-15 ENCOUNTER — Encounter: Payer: Self-pay | Admitting: Family Medicine

## 2019-01-15 ENCOUNTER — Ambulatory Visit: Payer: Self-pay | Admitting: Family Medicine

## 2019-01-15 LAB — COMPREHENSIVE METABOLIC PANEL
ALT: 12 U/L (ref 0–35)
AST: 18 U/L (ref 0–37)
Albumin: 4.7 g/dL (ref 3.5–5.2)
Alkaline Phosphatase: 70 U/L (ref 39–117)
BUN: 17 mg/dL (ref 6–23)
CO2: 27 mEq/L (ref 19–32)
Calcium: 9.8 mg/dL (ref 8.4–10.5)
Chloride: 104 mEq/L (ref 96–112)
Creatinine, Ser: 0.86 mg/dL (ref 0.40–1.20)
GFR: 69.87 mL/min (ref >60.00)
Glucose, Bld: 94 mg/dL (ref 70–99)
Potassium: 3.8 mEq/L (ref 3.5–5.1)
Sodium: 140 mEq/L (ref 135–145)
Total Bilirubin: 0.5 mg/dL (ref 0.2–1.2)
Total Protein: 7.4 g/dL (ref 6.0–8.3)

## 2019-01-15 LAB — CBC WITH DIFFERENTIAL/PLATELET
Basophils Absolute: 0.1 10*3/uL (ref 0.0–0.1)
Basophils Relative: 1.3 % (ref 0.0–3.0)
Eosinophils Absolute: 0.2 10*3/uL (ref 0.0–0.7)
Eosinophils Relative: 2.1 % (ref 0.0–5.0)
HCT: 41.2 % (ref 36.0–46.0)
Hemoglobin: 13.7 g/dL (ref 12.0–15.0)
Lymphocytes Relative: 36.8 % (ref 12.0–46.0)
Lymphs Abs: 3.3 10*3/uL (ref 0.7–4.0)
MCHC: 33.3 g/dL (ref 30.0–36.0)
MCV: 89.8 fl (ref 78.0–100.0)
Monocytes Absolute: 0.6 10*3/uL (ref 0.1–1.0)
Monocytes Relative: 6.6 % (ref 3.0–12.0)
Neutro Abs: 4.8 10*3/uL (ref 1.4–7.7)
Neutrophils Relative %: 53.2 % (ref 43.0–77.0)
Platelets: 274 10*3/uL (ref 150.0–400.0)
RBC: 4.58 Mil/uL (ref 3.87–5.11)
RDW: 12.9 % (ref 11.5–15.5)
WBC: 9.1 10*3/uL (ref 4.0–10.5)

## 2019-01-15 LAB — URINE CULTURE
MICRO NUMBER:: 720791
Result:: NO GROWTH
SPECIMEN QUALITY:: ADEQUATE

## 2019-01-15 LAB — TSH: TSH: 2.42 u[IU]/mL (ref 0.35–4.50)

## 2019-01-15 LAB — T3: T3, Total: 105 ng/dL (ref 76–181)

## 2019-01-15 LAB — T4, FREE: Free T4: 0.93 ng/dL (ref 0.60–1.60)

## 2019-01-15 NOTE — Telephone Encounter (Signed)
Pt called in and was given urine result from Dr. Jonni Sanger dated 01/14/2019 at 4:46 PM.  She verbalized understanding of seeing a urologist.    She thought maybe she had seen one before but could not remember.    Asking to have an appt with Dr. Jonni Sanger to come in and discuss some things with her. There was not a note pertaining to her blood work.  I warm transferred her call into Dr. Tamela Oddi office to [facility] for scheduling.

## 2019-01-20 ENCOUNTER — Encounter: Payer: Self-pay | Admitting: Family Medicine

## 2019-01-20 ENCOUNTER — Ambulatory Visit (INDEPENDENT_AMBULATORY_CARE_PROVIDER_SITE_OTHER): Payer: 59 | Admitting: Family Medicine

## 2019-01-20 DIAGNOSIS — E039 Hypothyroidism, unspecified: Secondary | ICD-10-CM

## 2019-01-20 DIAGNOSIS — F430 Acute stress reaction: Secondary | ICD-10-CM

## 2019-01-20 DIAGNOSIS — R3129 Other microscopic hematuria: Secondary | ICD-10-CM | POA: Diagnosis not present

## 2019-01-20 DIAGNOSIS — R232 Flushing: Secondary | ICD-10-CM

## 2019-01-20 DIAGNOSIS — K219 Gastro-esophageal reflux disease without esophagitis: Secondary | ICD-10-CM | POA: Diagnosis not present

## 2019-01-20 DIAGNOSIS — Z1239 Encounter for other screening for malignant neoplasm of breast: Secondary | ICD-10-CM

## 2019-01-20 DIAGNOSIS — E038 Other specified hypothyroidism: Secondary | ICD-10-CM

## 2019-01-20 HISTORY — DX: Other microscopic hematuria: R31.29

## 2019-01-20 HISTORY — DX: Flushing: R23.2

## 2019-01-20 MED ORDER — DICLOFENAC SODIUM 1 % TD GEL: 2.0000 g | Freq: Four times a day (QID) | TRANSDERMAL | 2 refills | Status: AC

## 2019-01-20 NOTE — Progress Notes (Signed)
Virtual Visit via Video Note  Subjective  CC:  Chief Complaint  Patient presents with  . Hypothyroidism    Wants to discuss recent labs and medication need     I connected with Judy Parks Funderburk on 01/20/19 at  4:00 PM EDT by a video enabled telemedicine application and verified that I am speaking with the correct person using two identifiers. Location patient: Home Location provider: [facility] [address] Persons participating in the virtual visit: Judy Parks [place], Willow Oraamille L Michale Weikel, MD Rita Oharaiara Simmons, CMA  I discussed the limitations of evaluation and management by telemedicine and the availability of in person appointments. The patient expressed understanding and agreed to proceed. HPI: Judy Parks Judy Parks is a 48 y.o. female who was contacted today to address the problems listed above in the chief complaint. . 48 yo female for f/u. See last note. Had multiple complaints, but history was most c/w stress related concerns. Since our visit, she has calmed down, started exercise, and is feeling much much better. GERD sxs are improved. C/o hot flushes. Mood is better. We reviewed recent labwork that was all normal. She is reassured by this.  . Persistent hematuria by UA; negative eval by urology. We have reviewed records now. Benign. No further eval needed.  . H/o subclinical hypothyroidism: now normalize. Will monitor. No supplements at this time.  Judy Parks. HM: due for mammogram. cpe due in 05/2019 Assessment  1. Stress reaction   2. Subclinical hypothyroidism   3. Gastroesophageal reflux disease, esophagitis presence not specified   4. Microscopic hematuria   5. Vasomotor flushing   6. Breast cancer screening      Plan   Stress reaction:  Improved with better insight now. Continue healthy lifestyle therapies.   PPI x 2 weeks.   Monitor hot flushes. Consider HRT if worsen.   mammo ordered.  I discussed the assessment and treatment plan with the patient. The patient was  provided an opportunity to ask questions and all were answered. The patient agreed with the plan and demonstrated an understanding of the instructions.   The patient was advised to call back or seek an in-person evaluation if the symptoms worsen or if the condition fails to improve as anticipated. Follow up: cpe december  Visit date not found  Meds ordered this encounter  Medications  . diclofenac sodium (VOLTAREN) 1 % GEL    Sig: Apply 2 g topically 4 (four) times daily.    Dispense:  400 g    Refill:  2      I reviewed the patients updated PMH, FH, and SocHx.    Patient Active Problem List   Diagnosis Date Noted  . Microscopic hematuria 01/20/2019  . Vasomotor flushing 01/20/2019  . Decreased libido 07/23/2018  . Subclinical hypothyroidism 12/12/2012  . Non-celiac gluten sensitivity 12/10/2012   No outpatient medications have been marked as taking for the 01/20/19 encounter (Office Visit) with Willow OraAndy, Leydy Worthey L, MD.    Allergies: Patient is allergic to gluten meal and penicillins. Family History: Patient family history includes Brain cancer in her maternal grandmother; Heart disease in her maternal grandmother; Stroke in her maternal grandmother. Social History:  Patient  reports that she has never smoked. She has never used smokeless tobacco. She reports current alcohol use. She reports that she does not use drugs.  Review of Systems: Constitutional: Negative for fever malaise or anorexia Cardiovascular: negative for chest pain Respiratory: negative for SOB or persistent cough Gastrointestinal: negative for abdominal  pain  OBJECTIVE Vitals: There were no vitals taken for this visit. General: no acute distress , A&Ox3, appears calm and happy today.  No visits with results within 1 Day(s) from this visit.  Latest known visit with results is:  Office Visit on 01/14/2019  Component Date Value Ref Range Status  . Sodium 01/14/2019 140  135 - 145 mEq/L Final  . Potassium  01/14/2019 3.8  3.5 - 5.1 mEq/L Final  . Chloride 01/14/2019 104  96 - 112 mEq/L Final  . CO2 01/14/2019 27  19 - 32 mEq/L Final  . Glucose, Bld 01/14/2019 94  70 - 99 mg/dL Final  . BUN 16/10/960407/30/2020 17  6 - 23 mg/dL Final  . Creatinine, Ser 01/14/2019 0.86  0.40 - 1.20 mg/dL Final  . Total Bilirubin 01/14/2019 0.5  0.2 - 1.2 mg/dL Final  . Alkaline Phosphatase 01/14/2019 70  39 - 117 U/L Final  . AST 01/14/2019 18  0 - 37 U/L Final  . ALT 01/14/2019 12  0 - 35 U/L Final  . Total Protein 01/14/2019 7.4  6.0 - 8.3 g/dL Final  . Albumin 54/09/811907/30/2020 4.7  3.5 - 5.2 g/dL Final  . Calcium 14/78/295607/30/2020 9.8  8.4 - 10.5 mg/dL Final  . GFR 21/30/865707/30/2020 69.87  >60.00 mL/min Final  . Color, UA 01/14/2019 Yellow   Final  . Clarity, UA 01/14/2019 Clear   Final  . Glucose, UA 01/14/2019 Negative  Negative Final  . Bilirubin, UA 01/14/2019 Negative   Final  . Ketones, UA 01/14/2019 Negative   Final  . Spec Grav, UA 01/14/2019 <=1.005* 1.010 - 1.025 Final  . Blood, UA 01/14/2019 Positive   Final  . pH, UA 01/14/2019 6.0  5.0 - 8.0 Final  . Protein, UA 01/14/2019 Negative  Negative Final  . Urobilinogen, UA 01/14/2019 0.2  0.2 or 1.0 E.U./dL Final  . Nitrite, UA 84/69/629507/30/2020 Negative   Final  . Leukocytes, UA 01/14/2019 Negative  Negative Final  . MICRO NUMBER: 01/14/2019 [id number]   Final  . SPECIMEN QUALITY: 01/14/2019 Adequate   Final  . Sample Source 01/14/2019 NOT GIVEN   Final  . STATUS: 01/14/2019 FINAL   Final  . Result: 01/14/2019 No Growth   Final  . Free T4 01/14/2019 0.93  0.60 - 1.60 ng/dL Final  . T3, Total 01/02/725307/30/2020 105  76 - 181 ng/dL Final  . TSH 66/44/034707/30/2020 2.42  0.35 - 4.50 uIU/mL Final  . WBC 01/14/2019 9.1  4.0 - 10.5 K/uL Final  . RBC 01/14/2019 4.58  3.87 - 5.11 Mil/uL Final  . Hemoglobin 01/14/2019 13.7  12.0 - 15.0 g/dL Final  . HCT 42/59/563807/30/2020 41.2  36.0 - 46.0 % Final  . MCV 01/14/2019 89.8  78.0 - 100.0 fl Final  . MCHC 01/14/2019 33.3  30.0 - 36.0 g/dL Final  . RDW 75/64/332907/30/2020  12.9  11.5 - 15.5 % Final  . Platelets 01/14/2019 274.0  150.0 - 400.0 K/uL Final  . Neutrophils Relative % 01/14/2019 53.2  43.0 - 77.0 % Final  . Lymphocytes Relative 01/14/2019 36.8  12.0 - 46.0 % Final  . Monocytes Relative 01/14/2019 6.6  3.0 - 12.0 % Final  . Eosinophils Relative 01/14/2019 2.1  0.0 - 5.0 % Final  . Basophils Relative 01/14/2019 1.3  0.0 - 3.0 % Final  . Neutro Abs 01/14/2019 4.8  1.4 - 7.7 K/uL Final  . Lymphs Abs 01/14/2019 3.3  0.7 - 4.0 K/uL Final  . Monocytes Absolute 01/14/2019 0.6  0.1 -  1.0 K/uL Final  . Eosinophils Absolute 01/14/2019 0.2  0.0 - 0.7 K/uL Final  . Basophils Absolute 01/14/2019 0.1  0.0 - 0.1 K/uL Final    Leamon Arnt, MD

## 2019-02-08 ENCOUNTER — Encounter: Payer: Self-pay | Admitting: Family Medicine

## 2019-02-12 ENCOUNTER — Encounter: Payer: Self-pay | Admitting: Family Medicine

## 2019-03-10 ENCOUNTER — Encounter: Payer: Self-pay | Admitting: Family Medicine

## 2019-03-10 DIAGNOSIS — K219 Gastro-esophageal reflux disease without esophagitis: Secondary | ICD-10-CM

## 2019-03-16 ENCOUNTER — Other Ambulatory Visit: Payer: Self-pay

## 2019-03-16 ENCOUNTER — Ambulatory Visit
Admission: RE | Admit: 2019-03-16 | Discharge: 2019-03-16 | Disposition: A | Discharge status: Home / Self Care | Payer: 59 | Source: Ambulatory Visit | Attending: Family Medicine | Admitting: Family Medicine

## 2019-03-16 DIAGNOSIS — Z1239 Encounter for other screening for malignant neoplasm of breast: Secondary | ICD-10-CM

## 2019-03-24 ENCOUNTER — Ambulatory Visit: Payer: 59

## 2019-04-09 ENCOUNTER — Ambulatory Visit (INDEPENDENT_AMBULATORY_CARE_PROVIDER_SITE_OTHER): Payer: 59 | Admitting: Family Medicine

## 2019-04-09 ENCOUNTER — Encounter: Payer: Self-pay | Admitting: Family Medicine

## 2019-04-09 VITALS — Temp 100.4°F

## 2019-04-09 DIAGNOSIS — J3089 Other allergic rhinitis: Secondary | ICD-10-CM

## 2019-04-09 DIAGNOSIS — M791 Myalgia, unspecified site: Secondary | ICD-10-CM

## 2019-04-09 DIAGNOSIS — R509 Fever, unspecified: Secondary | ICD-10-CM | POA: Diagnosis not present

## 2019-04-09 NOTE — Progress Notes (Signed)
     Virtual Visit via Video Note  Subjective  CC:  Chief Complaint  Patient presents with  . Fever    Fever this morning was 100.4.[name] She reports chills and aches.. Denies any COVID exposure, has a dust allergy and has been exposed to dust.. Has been taking Zinc     I connected with Judy Parks on 04/09/19 at  4:00 PM EDT by a video enabled telemedicine application and verified that I am speaking with the correct person using two identifiers. Location patient: Home Location provider: [facility] [place], Office Persons participating in the virtual visit: Judy Parks, Judy Arnt, MD Judy Parks, [place] discussed the limitations of evaluation and management by telemedicine and the availability of in person appointments. The patient expressed understanding and agreed to proceed. HPI: Judy Parks is a 48 y.o. female who was contacted today to address the problems listed above in the chief complaint. . 48 yo with low grade fever and myalgias this am. Started as allergy sxs 2 days ago: exposed to dust as cleaning out basement (moving to [place] in 2 weeks), has rhinorrhea and some sneezing. Scratchy throat. Has been home w/o exposures to hardly anyone. No known sick contacts. No sob or cough. No gi sxs.  Has scheduled covid testing for tomorrow.  Assessment  1. Low grade fever   2. Myalgia   3. Dust allergy      Plan   fever:  Test for covid. It is possible, but also could be other virus. Supportive care for fever and body aches. Keep hydrated. Await covid test results home isolation till then. Monitor for worsening sxs.   Restart allergy meds: flonase and allegra. Monitor for sinus infection sxs.  I discussed the assessment and treatment plan with the patient. The patient was provided an opportunity to ask questions and all were answered. The patient agreed with the plan and demonstrated an understanding of the instructions.   The patient was advised to  call back or seek an in-person evaluation if the symptoms worsen or if the condition fails to improve as anticipated. Follow up:  prn Visit date not found  No orders of the defined types were placed in this encounter.     I reviewed the patients updated PMH, FH, and SocHx.    Patient Active Problem List   Diagnosis Date Noted  . Microscopic hematuria 01/20/2019  . Vasomotor flushing 01/20/2019  . Decreased libido 07/23/2018  . Subclinical hypothyroidism 12/12/2012  . Non-celiac gluten sensitivity 12/10/2012   Current Meds  Medication Sig  . fluticasone (FLONASE) 50 MCG/ACT nasal spray Place 1 spray into both nostrils daily.    Allergies: Patient is allergic to gluten meal and penicillins. Family History: Patient family history includes Brain cancer in her maternal grandmother; Heart disease in her maternal grandmother; Stroke in her maternal grandmother. Social History:  Patient  reports that she has never smoked. She has never used smokeless tobacco. She reports current alcohol use. She reports that she does not use drugs.  Review of Systems: Constitutional: Negative for fever malaise or anorexia Cardiovascular: negative for chest pain Respiratory: negative for SOB or persistent cough Gastrointestinal: negative for abdominal pain  OBJECTIVE Vitals: Temp (!) 100.4 F (38 C) (Oral)  General: no acute distress , A&Ox3 Nasal congestion present. No respiratory distress. Appears well Judy Arnt, MD

## 2019-04-11 ENCOUNTER — Encounter: Payer: Self-pay | Admitting: Family Medicine

## 2019-04-13 ENCOUNTER — Ambulatory Visit (INDEPENDENT_AMBULATORY_CARE_PROVIDER_SITE_OTHER): Payer: 59 | Admitting: Family Medicine

## 2019-04-13 ENCOUNTER — Encounter: Payer: Self-pay | Admitting: Family Medicine

## 2019-04-13 DIAGNOSIS — U071 COVID-19: Secondary | ICD-10-CM

## 2019-04-13 NOTE — Progress Notes (Signed)
     Virtual Visit via Video Note  Subjective  CC:  Chief Complaint  Patient presents with  . COVID    Tested positive, last day of fevers & body aches was 10/25. Reports she is feeling bettter     I connected with [name] on 04/13/19 at 11:20 AM EDT by a video enabled telemedicine application and verified that I am speaking with the correct person using two identifiers. Location patient: Home Location provider: [facility] [place], Office Persons participating in the virtual visit: Judy Parks, Leamon Arnt, MD Lilli Light, [place] discussed the limitations of evaluation and management by telemedicine and the availability of in person appointments. The patient expressed understanding and agreed to proceed. HPI: Judy Parks is a 48 y.o. female who was contacted today to address the problems listed above in the chief complaint. [name] + covid: see last note. No longer with fevers or chills. Has mild congestion left only. No cough or sob. No GI sxs. Eating bone broth soup and getting good rest. Postponed her new [place] transfer to [place] due to covid. Has questions about isolation and repeat testing. Thinks her new employer, [employer], will require a negative test prior to starting.  Assessment  1. COVID-19 virus infection      Plan   covid 19 infection:  Improving.continue supportive care. Home isolation recommended until 10days sx free. Call back if worsens.   I discussed the assessment and treatment plan with the patient. The patient was provided an opportunity to ask questions and all were answered. The patient agreed with the plan and demonstrated an understanding of the instructions.   The patient was advised to call back or seek an in-person evaluation if the symptoms worsen or if the condition fails to improve as anticipated. Follow up: No follow-ups on file.  Visit date not found  No orders of the defined types were placed in this encounter.     I reviewed  the patients updated PMH, FH, and SocHx.    Patient Active Problem List   Diagnosis Date Noted  . Microscopic hematuria 01/20/2019  . Vasomotor flushing 01/20/2019  . Decreased libido 07/23/2018  . Subclinical hypothyroidism 12/12/2012  . Non-celiac gluten sensitivity 12/10/2012   Current Meds  Medication Sig  . diclofenac sodium (VOLTAREN) 1 % GEL Apply 2 g topically 4 (four) times daily.  . fluticasone (FLONASE) 50 MCG/ACT nasal spray Place 1 spray into both nostrils daily.    Allergies: Patient is allergic to gluten meal and penicillins. Family History: Patient family history includes Brain cancer in her maternal grandmother; Heart disease in her maternal grandmother; Stroke in her maternal grandmother. Social History:  Patient  reports that she has never smoked. She has never used smokeless tobacco. She reports current alcohol use. She reports that she does not use drugs.  Review of Systems: Constitutional: Negative for fever malaise or anorexia Cardiovascular: negative for chest pain Respiratory: negative for SOB or persistent cough Gastrointestinal: negative for abdominal pain  OBJECTIVE Vitals: There were no vitals taken for this visit. General: no acute distress , A&Ox3 Mild congestion Reports afebrile No respiratory distress Leamon Arnt, MD

## 2019-04-15 ENCOUNTER — Encounter: Payer: Self-pay | Admitting: Family Medicine

## 2020-02-24 IMAGING — CT CT ABD-PELV W/ CM
3 of 5 series · 16 of 46 positions shown, 18 images · IV contrast (ISOVUE)
Comparison: CT of the abdomen pelvis dated 03/14/2018

CLINICAL DATA: 47-year-old female with blunt abdominal trauma.

EXAM:
CT ABDOMEN AND PELVIS WITH CONTRAST
TECHNIQUE: Multidetector CT imaging of the abdomen and pelvis was performed
using the standard protocol following bolus administration of
intravenous contrast.
CONTRAST:  100mL 2NY0HN-8MM IOPAMIDOL (2NY0HN-8MM) INJECTION 61%,
50mL OMNIPAQUE IOHEXOL 300 MG/ML SOLN

[Series 3: cystogram · axial · 0.98mm/px · z∈[-672,-338]mm · 10 of 83 slices shown, 12 images]
[im 8/83  soft-tissue]
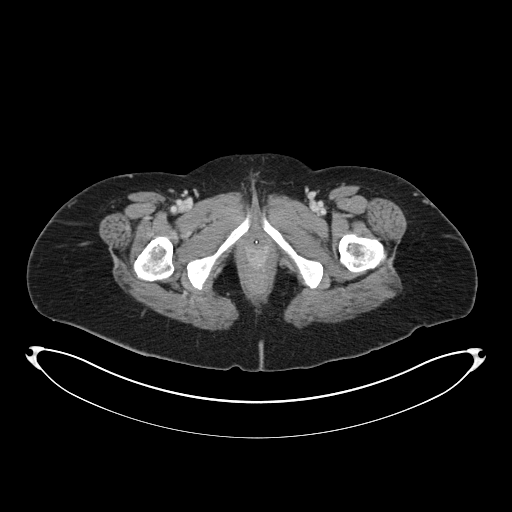
[im 8/83  bone]
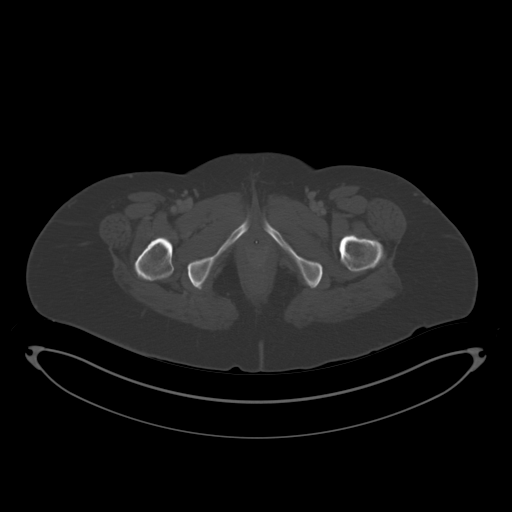
[im 15/83  soft-tissue]
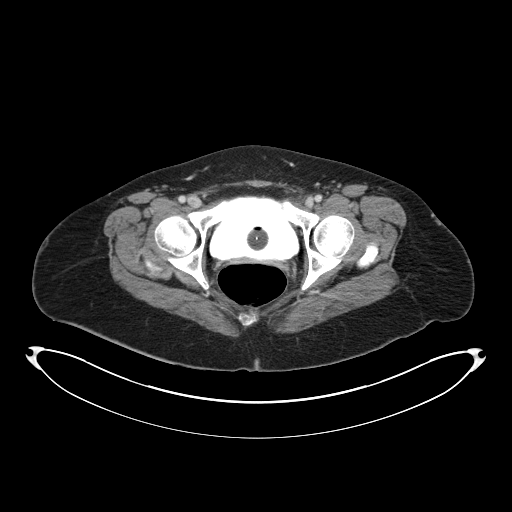
[im 23/83  soft-tissue]
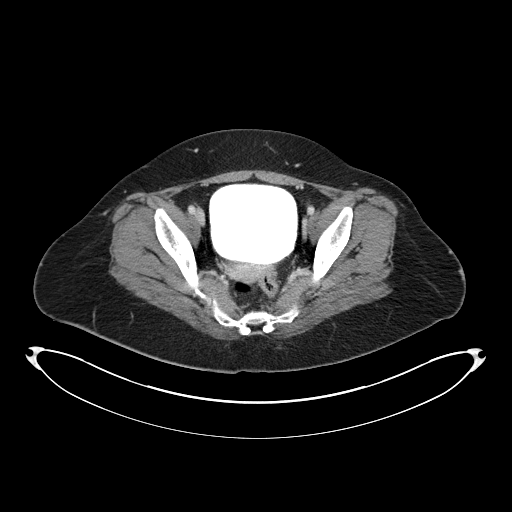
[im 30/83  soft-tissue]
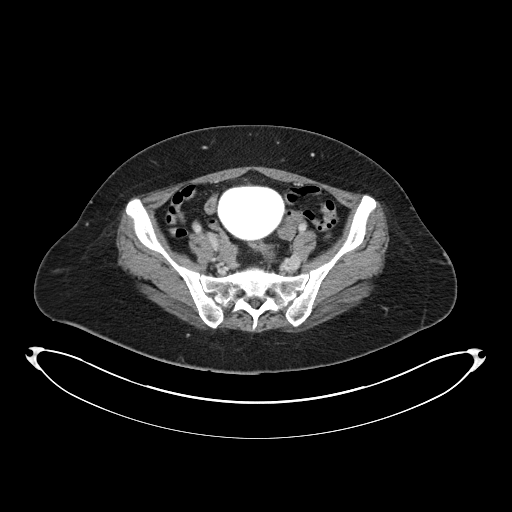
[im 38/83  soft-tissue]
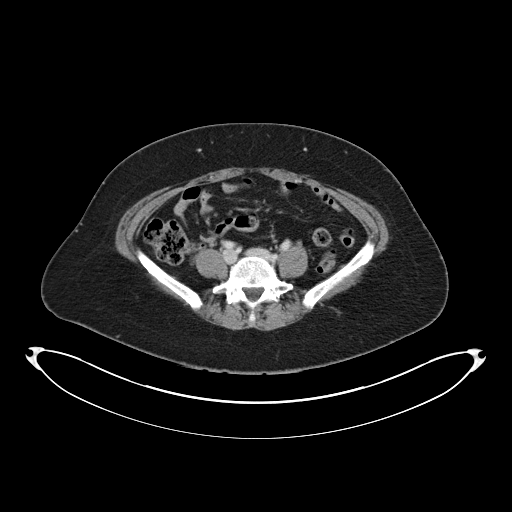
[im 45/83  soft-tissue]
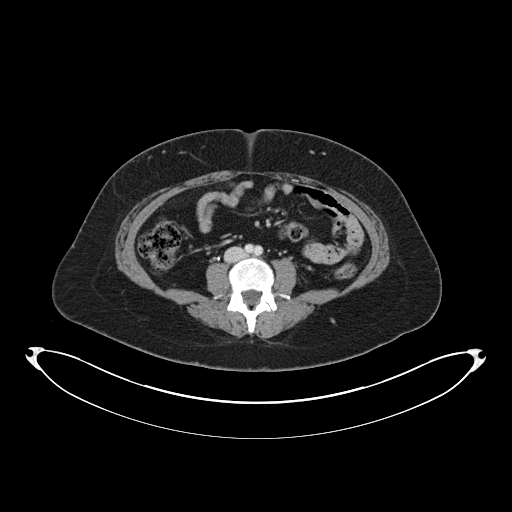
[im 53/83  soft-tissue]
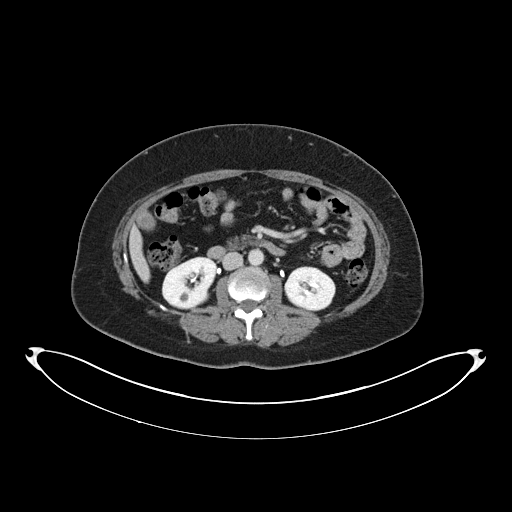
[im 60/83  soft-tissue]
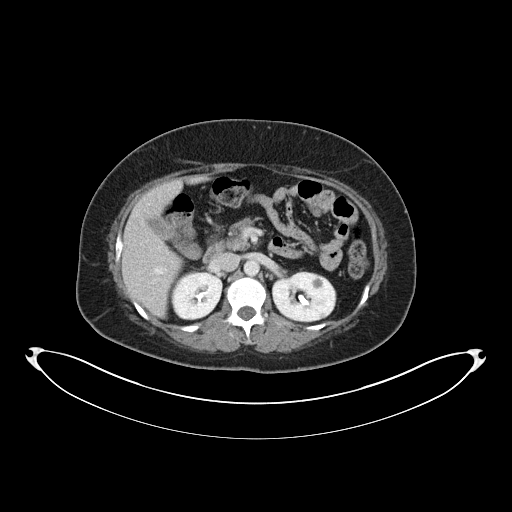
[im 68/83  soft-tissue]
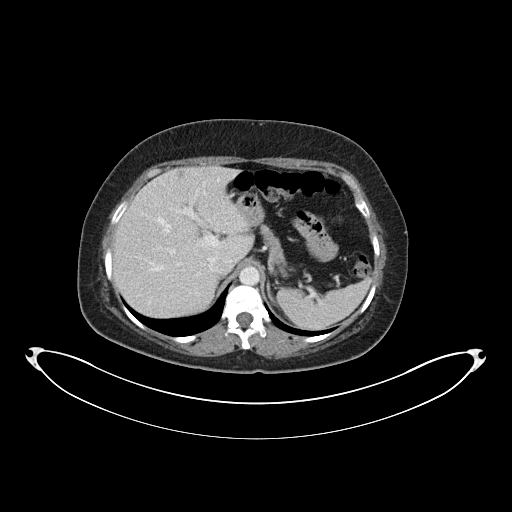
[im 68/83  bone]
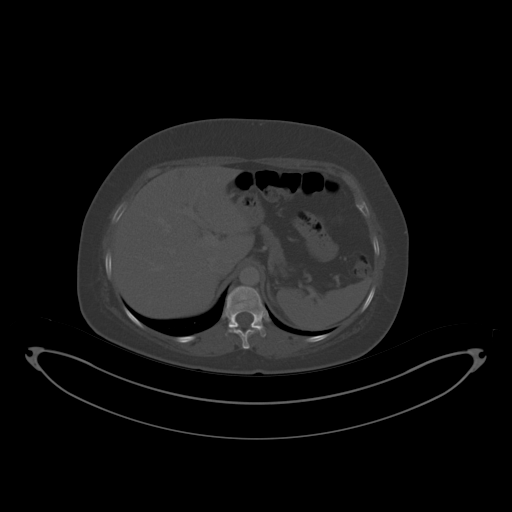
[im 75/83  soft-tissue]
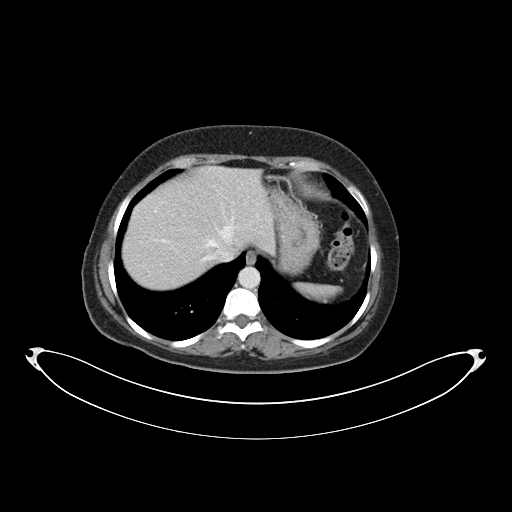

[Series 5: coronal · coronal · 0.75mm/px · 3 of 101 slices shown]
[im 34/101  soft-tissue]
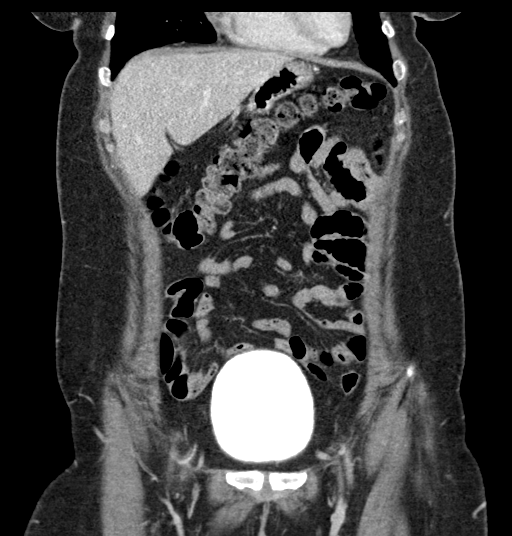
[im 45/101  soft-tissue]
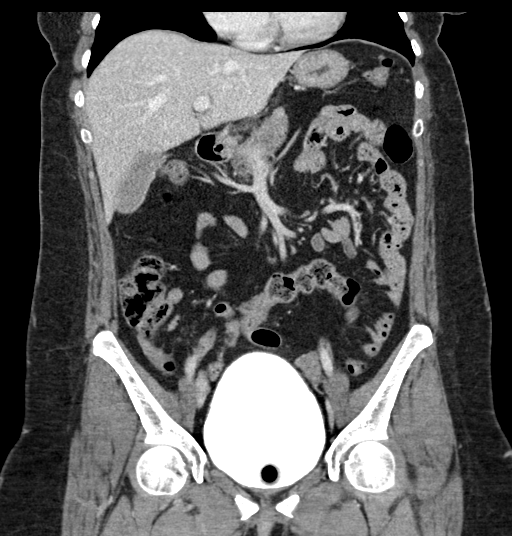
[im 56/101  soft-tissue]
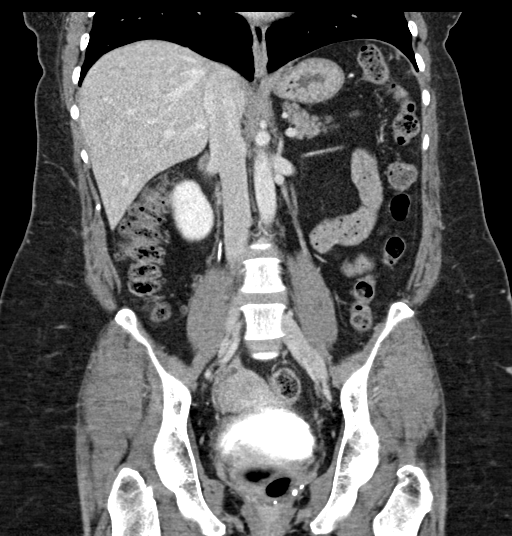

[Series 7: lung · axial · 0.61mm/px · z∈[-385,-357]mm · 3 of 51 slices shown]
[im 8/51  bone]
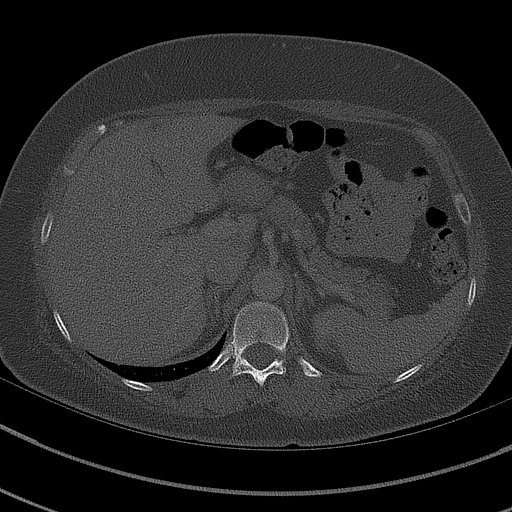
[im 15/51  bone]
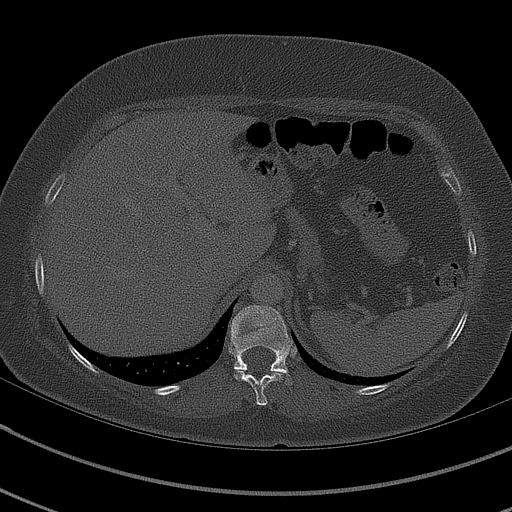
[im 22/51  bone]
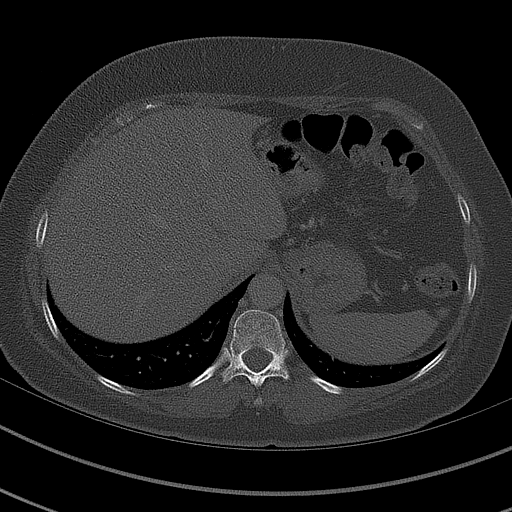

[16 of 46 positions shown; findings below may reference images not displayed]

FINDINGS: Lower chest: The visualized lung bases are clear.

No intra-abdominal free air or free fluid.

Hepatobiliary: There is a 1 cm cyst in the anterior liver. The liver
is otherwise unremarkable. No intrahepatic biliary ductal
dilatation. No calcified gallstone or pericholecystic fluid.

Pancreas: Unremarkable. No pancreatic ductal dilatation or
surrounding inflammatory changes.

Spleen: Normal in size without focal abnormality.

Adrenals/Urinary Tract: The adrenal glands are unremarkable. Small
bilateral renal cysts. There is no hydronephrosis on either side.
Symmetric enhancement and excretion of contrast by both kidneys. The
visualized ureters appear unremarkable. The urinary bladder is
distended with contrast injected via Foley catheter. Small amount of
air within the bladder likely introduced via Foley. No evidence of
acute/traumatic bladder injury.

Stomach/Bowel: Small scattered colonic diverticula without active
inflammatory changes. There is no bowel obstruction or active
inflammation. Normal appendix.

Vascular/Lymphatic: The abdominal aorta and IVC are unremarkable. No
portal venous gas. There is no adenopathy.

Reproductive: The uterus and ovaries are grossly unremarkable. No
pelvic mass.

Other: None

Musculoskeletal: Mild T12 and L1 superior endplate compression
fractures, likely acute. No retropulsed fragment.
IMPRESSION: Mild acute appearing compression fractures of the T12 and L1
superior endplate with less than 10% loss of vertebral body height.
No retropulsed fragment. No other acute/traumatic intra-abdominal or
pelvic pathology. No evidence of traumatic bladder injury.
